# Patient Record
Sex: Female | Born: 1965 | Race: Black or African American | Hispanic: No | Marital: Married | Smoking: Never smoker
Health system: Southern US, Community
[De-identification: ages and names within clinical notes are randomized; demographics above are authoritative.]

## PROBLEM LIST (undated history)

## (undated) HISTORY — PX: WISDOM TOOTH EXTRACTION: SHX21

## (undated) HISTORY — PX: ENDOMETRIAL ABLATION: SHX621

---

## 2002-11-02 ENCOUNTER — Other Ambulatory Visit: Admission: RE | Admit: 2002-11-02 | Discharge: 2002-11-02 | Payer: Self-pay | Admitting: *Deleted

## 2003-07-12 ENCOUNTER — Other Ambulatory Visit: Admission: RE | Admit: 2003-07-12 | Discharge: 2003-07-12 | Payer: Self-pay | Admitting: *Deleted

## 2004-01-31 ENCOUNTER — Other Ambulatory Visit: Admission: RE | Admit: 2004-01-31 | Discharge: 2004-01-31 | Payer: Self-pay | Admitting: *Deleted

## 2004-08-21 ENCOUNTER — Other Ambulatory Visit: Admission: RE | Admit: 2004-08-21 | Discharge: 2004-08-21 | Payer: Self-pay | Admitting: Obstetrics and Gynecology

## 2004-10-20 ENCOUNTER — Other Ambulatory Visit: Admission: RE | Admit: 2004-10-20 | Discharge: 2004-10-20 | Payer: Self-pay | Admitting: Obstetrics and Gynecology

## 2005-06-18 ENCOUNTER — Other Ambulatory Visit: Admission: RE | Admit: 2005-06-18 | Discharge: 2005-06-18 | Payer: Self-pay | Admitting: Obstetrics and Gynecology

## 2005-12-20 ENCOUNTER — Other Ambulatory Visit: Admission: RE | Admit: 2005-12-20 | Discharge: 2005-12-20 | Payer: Self-pay | Admitting: Obstetrics and Gynecology

## 2007-11-17 ENCOUNTER — Encounter: Admission: RE | Admit: 2007-11-17 | Discharge: 2007-11-17 | Payer: Self-pay | Admitting: Obstetrics and Gynecology

## 2008-10-13 ENCOUNTER — Encounter: Admission: RE | Admit: 2008-10-13 | Discharge: 2008-10-13 | Payer: Self-pay | Admitting: Obstetrics and Gynecology

## 2008-10-20 ENCOUNTER — Encounter: Admission: RE | Admit: 2008-10-20 | Discharge: 2008-10-20 | Payer: Self-pay | Admitting: Obstetrics and Gynecology

## 2009-10-13 ENCOUNTER — Encounter: Admission: RE | Admit: 2009-10-13 | Discharge: 2009-10-13 | Payer: Self-pay | Admitting: Obstetrics and Gynecology

## 2010-10-16 ENCOUNTER — Encounter
Admission: RE | Admit: 2010-10-16 | Discharge: 2010-10-16 | Payer: Self-pay | Source: Home / Self Care | Attending: Obstetrics and Gynecology | Admitting: Obstetrics and Gynecology

## 2010-10-24 ENCOUNTER — Encounter
Admission: RE | Admit: 2010-10-24 | Discharge: 2010-10-24 | Payer: Self-pay | Source: Home / Self Care | Attending: Obstetrics and Gynecology | Admitting: Obstetrics and Gynecology

## 2010-10-28 ENCOUNTER — Encounter: Payer: Self-pay | Admitting: Obstetrics and Gynecology

## 2011-09-28 ENCOUNTER — Other Ambulatory Visit: Payer: Self-pay | Admitting: Obstetrics and Gynecology

## 2011-09-28 DIAGNOSIS — N920 Excessive and frequent menstruation with regular cycle: Secondary | ICD-10-CM

## 2011-09-28 DIAGNOSIS — D219 Benign neoplasm of connective and other soft tissue, unspecified: Secondary | ICD-10-CM

## 2011-10-03 ENCOUNTER — Other Ambulatory Visit: Payer: Self-pay | Admitting: Radiology

## 2011-10-03 ENCOUNTER — Other Ambulatory Visit: Payer: Self-pay | Admitting: Emergency Medicine

## 2011-10-03 ENCOUNTER — Ambulatory Visit
Admission: RE | Admit: 2011-10-03 | Discharge: 2011-10-03 | Disposition: A | Discharge status: Home / Self Care | Payer: Managed Care, Other (non HMO) | Source: Ambulatory Visit | Attending: Obstetrics and Gynecology | Admitting: Obstetrics and Gynecology

## 2011-10-03 ENCOUNTER — Other Ambulatory Visit: Payer: Self-pay | Admitting: Obstetrics and Gynecology

## 2011-10-03 DIAGNOSIS — D219 Benign neoplasm of connective and other soft tissue, unspecified: Secondary | ICD-10-CM

## 2011-10-03 DIAGNOSIS — N920 Excessive and frequent menstruation with regular cycle: Secondary | ICD-10-CM

## 2011-10-03 DIAGNOSIS — F40240 Claustrophobia: Secondary | ICD-10-CM

## 2011-10-03 MED ORDER — DIAZEPAM 10 MG PO TABS: 10.0000 mg | ORAL_TABLET | Freq: Once | ORAL | Status: AC

## 2011-10-04 ENCOUNTER — Other Ambulatory Visit: Payer: Self-pay | Admitting: Obstetrics and Gynecology

## 2011-10-04 DIAGNOSIS — Z1231 Encounter for screening mammogram for malignant neoplasm of breast: Secondary | ICD-10-CM

## 2011-10-13 ENCOUNTER — Inpatient Hospital Stay: Admission: RE | Admit: 2011-10-13 | Payer: Self-pay | Source: Ambulatory Visit

## 2011-10-26 ENCOUNTER — Ambulatory Visit: Payer: Managed Care, Other (non HMO)

## 2011-10-27 ENCOUNTER — Inpatient Hospital Stay: Admission: RE | Admit: 2011-10-27 | Payer: Managed Care, Other (non HMO) | Source: Ambulatory Visit

## 2011-10-29 ENCOUNTER — Ambulatory Visit
Admission: RE | Admit: 2011-10-29 | Discharge: 2011-10-29 | Disposition: A | Discharge status: Home / Self Care | Payer: Managed Care, Other (non HMO) | Source: Ambulatory Visit | Attending: Obstetrics and Gynecology | Admitting: Obstetrics and Gynecology

## 2011-10-29 DIAGNOSIS — Z1231 Encounter for screening mammogram for malignant neoplasm of breast: Secondary | ICD-10-CM

## 2011-11-03 ENCOUNTER — Other Ambulatory Visit: Payer: Managed Care, Other (non HMO)

## 2011-11-10 ENCOUNTER — Inpatient Hospital Stay: Admission: RE | Admit: 2011-11-10 | Payer: Managed Care, Other (non HMO) | Source: Ambulatory Visit

## 2011-11-15 ENCOUNTER — Telehealth: Payer: Self-pay | Admitting: Emergency Medicine

## 2011-11-15 NOTE — Telephone Encounter (Signed)
ERROR

## 2011-12-01 ENCOUNTER — Ambulatory Visit
Admission: RE | Admit: 2011-12-01 | Discharge: 2011-12-01 | Disposition: A | Discharge status: Home / Self Care | Payer: Managed Care, Other (non HMO) | Source: Ambulatory Visit | Attending: Obstetrics and Gynecology | Admitting: Obstetrics and Gynecology

## 2011-12-01 DIAGNOSIS — N920 Excessive and frequent menstruation with regular cycle: Secondary | ICD-10-CM

## 2011-12-01 DIAGNOSIS — D219 Benign neoplasm of connective and other soft tissue, unspecified: Secondary | ICD-10-CM

## 2011-12-01 MED ORDER — GADOBENATE DIMEGLUMINE 529 MG/ML IV SOLN
15.0000 mL | Freq: Once | INTRAVENOUS | Status: AC | PRN
  Administered 2011-12-01: 15 mL via INTRAVENOUS

## 2012-01-09 ENCOUNTER — Telehealth: Payer: Self-pay | Admitting: Emergency Medicine

## 2012-01-09 NOTE — Telephone Encounter (Signed)
LM TO SEE IF PT IS INTERESTED IN UFE AND IF SHE HAS HAD THE EBX PERFORMED.   ebx on 01-22-12

## 2012-01-29 ENCOUNTER — Other Ambulatory Visit (HOSPITAL_COMMUNITY): Payer: Self-pay | Admitting: Interventional Radiology

## 2012-01-29 DIAGNOSIS — D219 Benign neoplasm of connective and other soft tissue, unspecified: Secondary | ICD-10-CM

## 2012-02-10 ENCOUNTER — Other Ambulatory Visit: Payer: Self-pay | Admitting: Radiology

## 2012-02-11 ENCOUNTER — Other Ambulatory Visit: Payer: Self-pay | Admitting: Physician Assistant

## 2012-02-11 ENCOUNTER — Telehealth (HOSPITAL_COMMUNITY): Payer: Self-pay | Admitting: Interventional Radiology

## 2012-02-12 ENCOUNTER — Ambulatory Visit (HOSPITAL_COMMUNITY)
Admission: RE | Admit: 2012-02-12 | Discharge: 2012-02-13 | Disposition: A | Discharge status: Home / Self Care | Payer: Managed Care, Other (non HMO) | Source: Ambulatory Visit | Attending: Interventional Radiology | Admitting: Interventional Radiology

## 2012-02-12 ENCOUNTER — Other Ambulatory Visit: Payer: Self-pay | Admitting: Radiology

## 2012-02-12 ENCOUNTER — Encounter (HOSPITAL_COMMUNITY): Payer: Self-pay

## 2012-02-12 ENCOUNTER — Other Ambulatory Visit (HOSPITAL_COMMUNITY): Payer: Managed Care, Other (non HMO)

## 2012-02-12 VITALS — BP 117/71 | HR 63 | Temp 98.0°F | Resp 16 | Ht 71.0 in | Wt 162.0 lb

## 2012-02-12 DIAGNOSIS — D219 Benign neoplasm of connective and other soft tissue, unspecified: Secondary | ICD-10-CM

## 2012-02-12 DIAGNOSIS — D259 Leiomyoma of uterus, unspecified: Principal | ICD-10-CM | POA: Insufficient documentation

## 2012-02-12 DIAGNOSIS — N920 Excessive and frequent menstruation with regular cycle: Secondary | ICD-10-CM | POA: Diagnosis present

## 2012-02-12 DIAGNOSIS — R110 Nausea: Secondary | ICD-10-CM | POA: Insufficient documentation

## 2012-02-12 LAB — PROTIME-INR
INR: 0.92 (ref 0.00–1.49)
Prothrombin Time: 12.6 seconds (ref 11.6–15.2)

## 2012-02-12 LAB — CBC
HCT: 37.3 % (ref 36.0–46.0)
Hemoglobin: 12.5 g/dL (ref 12.0–15.0)
MCH: 28.5 pg (ref 26.0–34.0)
MCHC: 33.5 g/dL (ref 30.0–36.0)
MCV: 85.2 fL (ref 78.0–100.0)
Platelets: 368 10*3/uL (ref 150–400)
RBC: 4.38 MIL/uL (ref 3.87–5.11)
RDW: 13.9 % (ref 11.5–15.5)
WBC: 7.6 10*3/uL (ref 4.0–10.5)

## 2012-02-12 LAB — BASIC METABOLIC PANEL
BUN: 12 mg/dL (ref 6–23)
CO2: 20 mEq/L (ref 19–32)
Calcium: 8.8 mg/dL (ref 8.4–10.5)
Chloride: 103 mEq/L (ref 96–112)
Creatinine, Ser: 0.72 mg/dL (ref 0.50–1.10)
GFR calc Af Amer: >90 mL/min (ref >90)
GFR calc non Af Amer: >90 mL/min (ref >90)
Glucose, Bld: 81 mg/dL (ref 70–99)
Potassium: 4.3 mEq/L (ref 3.5–5.1)
Sodium: 135 mEq/L (ref 135–145)

## 2012-02-12 LAB — APTT: aPTT: 32 seconds (ref 24–37)

## 2012-02-12 LAB — HCG, SERUM, QUALITATIVE: Preg, Serum: NEGATIVE

## 2012-02-12 MED ORDER — HYDROMORPHONE 0.3 MG/ML IV SOLN
INTRAVENOUS | Status: DC
  Administered 2012-02-12: 5.39 mg via INTRAVENOUS
  Administered 2012-02-13: 0.6 mg via INTRAVENOUS
  Administered 2012-02-13: 01:00:00 via INTRAVENOUS
  Administered 2012-02-13: 1.99 mg via INTRAVENOUS
  Filled 2012-02-12: qty 25

## 2012-02-12 MED ORDER — DIPHENHYDRAMINE HCL 50 MG/ML IJ SOLN: 12.5000 mg | Freq: Four times a day (QID) | INTRAMUSCULAR | Status: DC | PRN

## 2012-02-12 MED ORDER — CEFAZOLIN SODIUM 1-5 GM-% IV SOLN
1.0000 g | INTRAVENOUS | Status: AC
  Administered 2012-02-12: 1 g via INTRAVENOUS

## 2012-02-12 MED ORDER — DOCUSATE SODIUM 100 MG PO CAPS
100.0000 mg | ORAL_CAPSULE | Freq: Two times a day (BID) | ORAL | Status: DC
  Administered 2012-02-12 (×2): 100 mg via ORAL
  Filled 2012-02-12 (×4): qty 1

## 2012-02-12 MED ORDER — HYDROMORPHONE 0.3 MG/ML IV SOLN: INTRAVENOUS | Status: DC

## 2012-02-12 MED ORDER — ONDANSETRON HCL 4 MG/2ML IJ SOLN: 4.0000 mg | Freq: Four times a day (QID) | INTRAMUSCULAR | Status: DC | PRN

## 2012-02-12 MED ORDER — DIPHENHYDRAMINE HCL 12.5 MG/5ML PO ELIX: 12.5000 mg | ORAL_SOLUTION | Freq: Four times a day (QID) | ORAL | Status: DC | PRN

## 2012-02-12 MED ORDER — HYDROMORPHONE 0.3 MG/ML IV SOLN
INTRAVENOUS | Status: DC
  Administered 2012-02-12: 11:00:00 via INTRAVENOUS

## 2012-02-12 MED ORDER — KETOROLAC TROMETHAMINE 30 MG/ML IJ SOLN
INTRAMUSCULAR | Status: AC
  Filled 2012-02-12: qty 1

## 2012-02-12 MED ORDER — SODIUM CHLORIDE 0.9 % IV SOLN: 250.0000 mL | INTRAVENOUS | Status: DC | PRN

## 2012-02-12 MED ORDER — FENTANYL CITRATE 0.05 MG/ML IJ SOLN
INTRAMUSCULAR | Status: AC
  Filled 2012-02-12: qty 8

## 2012-02-12 MED ORDER — IBUPROFEN 800 MG PO TABS
800.0000 mg | ORAL_TABLET | Freq: Four times a day (QID) | ORAL | Status: DC
Start: 2012-02-12 — End: 2012-02-13
  Administered 2012-02-12 – 2012-02-13 (×4): 800 mg via ORAL
  Filled 2012-02-12 (×7): qty 1

## 2012-02-12 MED ORDER — MIDAZOLAM HCL 2 MG/2ML IJ SOLN
INTRAMUSCULAR | Status: AC
  Filled 2012-02-12: qty 6

## 2012-02-12 MED ORDER — KETOROLAC TROMETHAMINE 30 MG/ML IJ SOLN
30.0000 mg | INTRAMUSCULAR | Status: AC
  Administered 2012-02-12: 30 mg via INTRAVENOUS

## 2012-02-12 MED ORDER — ONDANSETRON HCL 4 MG/2ML IJ SOLN
4.0000 mg | Freq: Four times a day (QID) | INTRAMUSCULAR | Status: DC | PRN
  Administered 2012-02-12 – 2012-02-13 (×2): 4 mg via INTRAVENOUS
  Filled 2012-02-12 (×2): qty 2

## 2012-02-12 MED ORDER — MIDAZOLAM HCL 2 MG/2ML IJ SOLN
INTRAMUSCULAR | Status: AC
  Filled 2012-02-12: qty 2

## 2012-02-12 MED ORDER — NALOXONE HCL 0.4 MG/ML IJ SOLN: 0.4000 mg | INTRAMUSCULAR | Status: DC | PRN

## 2012-02-12 MED ORDER — FENTANYL CITRATE 0.05 MG/ML IJ SOLN
INTRAMUSCULAR | Status: AC | PRN
  Administered 2012-02-12 (×2): 100 ug via INTRAVENOUS

## 2012-02-12 MED ORDER — SODIUM CHLORIDE 0.9 % IV SOLN: INTRAVENOUS | Status: DC

## 2012-02-12 MED ORDER — MIDAZOLAM HCL 5 MG/5ML IJ SOLN
INTRAMUSCULAR | Status: AC | PRN
  Administered 2012-02-12 (×3): 2 mg via INTRAVENOUS

## 2012-02-12 MED ORDER — PROMETHAZINE HCL 25 MG RE SUPP: 25.0000 mg | Freq: Three times a day (TID) | RECTAL | Status: DC | PRN

## 2012-02-12 MED ORDER — SODIUM CHLORIDE 0.9 % IJ SOLN
3.0000 mL | Freq: Two times a day (BID) | INTRAMUSCULAR | Status: DC
  Administered 2012-02-12: 3 mL via INTRAVENOUS

## 2012-02-12 MED ORDER — SODIUM CHLORIDE 0.9 % IJ SOLN: 3.0000 mL | INTRAMUSCULAR | Status: DC | PRN

## 2012-02-12 MED ORDER — DIPHENHYDRAMINE HCL 12.5 MG/5ML PO ELIX
12.5000 mg | ORAL_SOLUTION | Freq: Four times a day (QID) | ORAL | Status: DC | PRN
  Filled 2012-02-12: qty 5

## 2012-02-12 MED ORDER — SODIUM CHLORIDE 0.9 % IJ SOLN: 9.0000 mL | INTRAMUSCULAR | Status: DC | PRN

## 2012-02-12 MED ORDER — CEFAZOLIN SODIUM 1-5 GM-% IV SOLN
INTRAVENOUS | Status: AC
  Filled 2012-02-12: qty 50

## 2012-02-12 MED ORDER — PROMETHAZINE HCL 25 MG PO TABS: 25.0000 mg | ORAL_TABLET | Freq: Three times a day (TID) | ORAL | Status: DC | PRN

## 2012-02-12 NOTE — H&P (Signed)
Robin Wheeler is an 46 y.o. female.   Chief Complaint: symptomatic uterine fibroids HPI: Patient with history of menorrhagia, bloating secondary to uterine fibroids presents today for elective bilateral uterine artery embolization.  PMH: fibroids PSH: none FH:  Positive for asthma,DM, HTN, heart disease                                                                              Social History: married, 1 daughter, denies alcohol, tobacco; Jehovah's witness Allergies:  Allergies  Allergen Reactions  . Vicodin (Hydrocodone-Acetaminophen) Nausea And Vomiting    Current outpatient prescriptions:cholecalciferol (VITAMIN D) 1000 UNITS tablet, Take 1,000 Units by mouth daily., Disp: , Rfl: ;  omega-3 acid ethyl esters (LOVAZA) 1 G capsule, Take 2 g by mouth daily., Disp: , Rfl: ;  vitamin C (ASCORBIC ACID) 500 MG tablet, Take 500 mg by mouth daily., Disp: , Rfl:  Current facility-administered medications:0.9 %  sodium chloride infusion, , Intravenous, Continuous, D Kevin Americus Scheurich, PA;  ceFAZolin (ANCEF) 1-5 GM-% IVPB, , , , ;  ceFAZolin (ANCEF) IVPB 1 g/50 mL premix, 1 g, Intravenous, On Call, D Kevin Tron Flythe, PA;  ketorolac (TORADOL) 30 MG/ML injection 30 mg, 30 mg, Intravenous, On Call, D Jeananne Rama, PA   Results for orders placed during the hospital encounter of 02/12/12 (from the past 48 hour(s))  APTT     Status: Normal   Collection Time   02/12/12  8:10 AM      Component Value Range Comment   aPTT 32  24 - 37 (seconds)   BASIC METABOLIC PANEL     Status: Normal   Collection Time   02/12/12  8:10 AM      Component Value Range Comment   Sodium 135  135 - 145 (mEq/L)    Potassium 4.3  3.5 - 5.1 (mEq/L)    Chloride 103  96 - 112 (mEq/L)    CO2 20  19 - 32 (mEq/L)    Glucose, Bld 81  70 - 99 (mg/dL)    BUN 12  6 - 23 (mg/dL)    Creatinine, Ser 1.61  0.50 - 1.10 (mg/dL)    Calcium 8.8  8.4 - 10.5 (mg/dL)    GFR calc non Af Amer >90  >90 (mL/min)    GFR calc Af Amer >90  >90 (mL/min)   CBC      Status: Normal   Collection Time   02/12/12  8:10 AM      Component Value Range Comment   WBC 7.6  4.0 - 10.5 (K/uL)    RBC 4.38  3.87 - 5.11 (MIL/uL)    Hemoglobin 12.5  12.0 - 15.0 (g/dL)    HCT 09.6  04.5 - 40.9 (%)    MCV 85.2  78.0 - 100.0 (fL)    MCH 28.5  26.0 - 34.0 (pg)    MCHC 33.5  30.0 - 36.0 (g/dL)    RDW 81.1  91.4 - 78.2 (%)    Platelets 368  150 - 400 (K/uL)   PROTIME-INR     Status: Normal   Collection Time   02/12/12  8:10 AM      Component Value Range Comment   Prothrombin  Time 12.6  11.6 - 15.2 (seconds)    INR 0.92  0.00 - 1.49     No results found.  Review of Systems  Constitutional: Negative for fever and chills.  Respiratory: Negative for cough and shortness of breath.   Cardiovascular: Negative for chest pain.  Gastrointestinal: Positive for abdominal pain. Negative for nausea and vomiting.       Intermittent pelvic pain  Genitourinary:       Menorrhagia   Musculoskeletal: Negative for back pain.  Neurological: Positive for headaches.    Blood pressure 129/89, pulse 73, temperature 98 F (36.7 C), temperature source Oral, resp. rate 21, last menstrual period 01/26/2012, SpO2 100.00%. Physical Exam  Constitutional: She is oriented to person, place, and time. She appears well-developed and well-nourished.  Cardiovascular: Normal rate and regular rhythm.   Respiratory: Effort normal and breath sounds normal.  GI: Soft. Bowel sounds are normal.       Mildly tender mid pelvic region  Musculoskeletal: Normal range of motion. She exhibits no edema.  Neurological: She is alert and oriented to person, place, and time.     Assessment/Plan Patient with symptomatic uterine fibroids; plan is for bilateral uterine artery embolization followed by overnight observation for pain control/hemodynamic monitoring. Details/risks of procedure d/w pt/family with their understanding and consent.  Ranveer Wahlstrom,D [facility] 02/12/2012, 9:07 AM

## 2012-02-12 NOTE — Procedures (Signed)
Post bilateral UFE.  No immediate complications.  Keep right leg straight for 4 hrs.

## 2012-02-12 NOTE — Progress Notes (Signed)
Subjective: Pt drowsy but arousable; had some N/V earlier, currently ok; mild cramping  Objective: Vital signs in last 24 hours: Temp:  [97.3 F (36.3 C)-98.1 F (36.7 C)] 97.3 F (36.3 C) (05/21 1514) Pulse Rate:  [61-81] 81  (05/21 1514) Resp:  [6-21] 13  (05/21 1514) BP: (110-150)/(69-134) 116/77 mmHg (05/21 1514) SpO2:  [90 %-100 %] 96 % (05/21 1514) Last BM Date: 02/11/12  Intake/Output from previous day:   Intake/Output this shift:    Pt drowsy; chest- CTA bilat; heart-RRR, abd.- soft, +BS, mildly tender pelvic region; rt groin soft, NT, no hematoma; rt foot warm, distal pulses 1+; yellow urine in foley  Lab Results:   [facility] 02/12/12 0810  WBC 7.6  HGB 12.5  HCT 37.3  PLT 368   BMET  Basename 02/12/12 0810  NA 135  K 4.3  CL 103  CO2 20  GLUCOSE 81  BUN 12  CREATININE 0.72  CALCIUM 8.8   PT/INR  Basename 02/12/12 0810  LABPROT 12.6  INR 0.92   ABG No results found for this basename: PHART:2,PCO2:2,PO2:2,HCO3:2 in the last 72 hours  Studies/Results: Ir Uterine Fibroid Embolization Mod Sed  02/12/2012  *RADIOLOGY REPORT*  Indication: Symptomatic uterine fibroids  1.  UTERINE FIBROID EMBOLIZATION 2.  ULTRASOUND GUIDANCE FOR ARTERIAL ACCESS  Comparison:  Pelvic MRI - 12/01/2011  Medications:  Fentanyl 200 mcg IV; Versed 6 mg IV; Toradol 30 mg IV; Ancef 1 grams IV; administered within one hour of the procedure  Sedation time: 105 minutes  Contrast volume: 100 mL Omnipaque-300  Fluoroscopy time:  20.8 minutes  Complications: None immediate  PROCEDURE / FINDINGS:  Informed consent was obtained from the patient following explanation of the procedure, risks, benefits and alternatives. The patient understands, agrees and consents for the procedure. All questions were addressed.  A time out was performed prior to the initiation of the procedure.  Maximal barrier sterile technique utilized including caps, mask, sterile gowns, sterile gloves, large sterile drape,  hand hygiene, and Betadine prep.  The right femoral head was marked fluoroscopically.  Under sterile conditions and local anesthesia, the right common femoral artery access was performed with a micropuncture needle.  Under direct ultrasound guidance, the right common femoral was accessed with a micropuncture kit.  An ultrasound image was saved for documentation purposes. This allowed for placement of a 5-French vascular sheath. A limited arteriogram was performed through the side arm of the sheath confirming appropriate access within the right common femoral artery.  Utilizing an Omniflush catheter, a stiff glide wire was advanced into the contralateral left common femoral artery.  Under intermittent fluoroscopic guidance, the Omniflush catheter was exchanged for a Robert's uterine catheter.  The 5-French Robert's uterine catheter was utilized to select the contralateral left internal iliac artery.  Selective left internal iliac angiogram was performed.  The origin of the tortuous left uterine artery was identified.  Selective catheterization was performed of the left uterine artery with a microcatheter and micro guide wire.  A selective left uterine angiogram was performed. This demonstrated patency of the left uterine artery.  Mild diffuse hypervascularity of the enlarged fibroid uterus. The micro catheter was advanced beyond the takeoff of the left-sided cervical vaginal branch.  Limited subselective injection of the left uterine artery was performed and deemed safe for embolization. For embolization, 1 vial of 500 - 700 micron Embospheres were injected into the left uterine artery.  Post embolization angiogram confirms complete stasis of the left uterine vascular territory.  Microcatheter was removed.  The Robert's uterine catheter was retracted and utilized to select the right internal iliac artery.  Selective right internal iliac angiogram was performed.  The patent right uterine artery was identified.  For  selective catheterization, the microcatheter and guidewire were utilized to select the right uterine artery. Selective right uterine angiogram was performed.  Catheter position was safe for embolization.  Embolization was performed to complete stasis with injection of 1 vial of 500-700 micron Embospheres. Post embolization angiogram confirms complete stasis of the right uterine vascular territory.  At this point, all wires, catheters and sheaths were removed from the patient.  Hemostasis was achieved at the right groin access site with manual compression.  The patient tolerated the procedure well without immediate post procedural complication.  IMPRESSION:  Successful bilateral uterine artery embolization (U F E).  Original Report Authenticated By: Waynard Reeds, M.D.    Anti-infectives: Anti-infectives     Start     Dose/Rate Route Frequency Ordered Stop   02/12/12 0815   ceFAZolin (ANCEF) IVPB 1 g/50 mL premix        1 g 100 mL/hr over 30 Minutes Intravenous On call 02/12/12 0812 02/12/12 1040   02/12/12 0807   ceFAZolin (ANCEF) 1-5 GM-% IVPB     Comments: DUININCK, STACEY: cabinet override         02/12/12 0807 02/12/12 2014          Assessment/Plan: s/p bilateral uterine artery embolization secondary to symptomatic fibroids; d/c foley; advance diet as tolerated; IV hydration; dilaudid PCA/antiemetics prn; for overnight obs   LOS: 0 days    Dimitrius Steedman,D [facility] 02/12/2012

## 2012-02-13 MED ORDER — DSS 100 MG PO CAPS: 100.0000 mg | ORAL_CAPSULE | Freq: Two times a day (BID) | ORAL | Status: AC

## 2012-02-13 MED ORDER — HYDROMORPHONE HCL PF 1 MG/ML IJ SOLN: 0.5000 mg | INTRAMUSCULAR | Status: DC | PRN

## 2012-02-13 MED ORDER — IBUPROFEN 600 MG PO TABS: 600.0000 mg | ORAL_TABLET | Freq: Three times a day (TID) | ORAL | Status: AC

## 2012-02-13 MED ORDER — SODIUM CHLORIDE 0.9 % IV SOLN: INTRAVENOUS | Status: DC

## 2012-02-13 NOTE — Progress Notes (Signed)
Subjective: Pt has been nauseated all night and early am. Has now tolerated small amount of apple, but nothing more. Pain control ok. Not using PCA. Voiding on own ok. Has only been OOB to bathroom.  Objective: Physical Exam: BP 121/76  Pulse 80  Temp(Src) 98.2 F (36.8 C) (Oral)  Resp 16  Ht 5\' 11"  (1.803 m)  Wt 162 lb (73.483 kg)  BMI 22.59 kg/m2  SpO2 98%  LMP 01/26/2012 Lungs: CTA without w/r/r Heart: Regular Abdomen: soft, ND, NT Ext: (R)groin site clean, no hematoma, pedal pulses intact    Labs: CBC  Basename 02/12/12 0810  WBC 7.6  HGB 12.5  HCT 37.3  PLT 368   BMET  Basename 02/12/12 0810  NA 135  K 4.3  CL 103  CO2 20  GLUCOSE 81  BUN 12  CREATININE 0.72  CALCIUM 8.8   LFT No results found for this basename: PROT,ALBUMIN,AST,ALT,ALKPHOS,BILITOT,BILIDIR,IBILI,LIPASE in the last 72 hours PT/INR  Basename 02/12/12 0810  LABPROT 12.6  INR 0.92     Studies/Results: Ir Uterine Fibroid Embolization Mod Sed  02/12/2012  *RADIOLOGY REPORT*  Indication: Symptomatic uterine fibroids  1.  UTERINE FIBROID EMBOLIZATION 2.  ULTRASOUND GUIDANCE FOR ARTERIAL ACCESS  Comparison:  Pelvic MRI - 12/01/2011  Medications:  Fentanyl 200 mcg IV; Versed 6 mg IV; Toradol 30 mg IV; Ancef 1 grams IV; administered within one hour of the procedure  Sedation time: 105 minutes  Contrast volume: 100 mL Omnipaque-300  Fluoroscopy time:  20.8 minutes  Complications: None immediate  PROCEDURE / FINDINGS:  Informed consent was obtained from the patient following explanation of the procedure, risks, benefits and alternatives. The patient understands, agrees and consents for the procedure. All questions were addressed.  A time out was performed prior to the initiation of the procedure.  Maximal barrier sterile technique utilized including caps, mask, sterile gowns, sterile gloves, large sterile drape, hand hygiene, and Betadine prep.  The right femoral head was marked fluoroscopically.   Under sterile conditions and local anesthesia, the right common femoral artery access was performed with a micropuncture needle.  Under direct ultrasound guidance, the right common femoral was accessed with a micropuncture kit.  An ultrasound image was saved for documentation purposes. This allowed for placement of a 5-French vascular sheath. A limited arteriogram was performed through the side arm of the sheath confirming appropriate access within the right common femoral artery.  Utilizing an Omniflush catheter, a stiff glide wire was advanced into the contralateral left common femoral artery.  Under intermittent fluoroscopic guidance, the Omniflush catheter was exchanged for a Robert's uterine catheter.  The 5-French Robert's uterine catheter was utilized to select the contralateral left internal iliac artery.  Selective left internal iliac angiogram was performed.  The origin of the tortuous left uterine artery was identified.  Selective catheterization was performed of the left uterine artery with a microcatheter and micro guide wire.  A selective left uterine angiogram was performed. This demonstrated patency of the left uterine artery.  Mild diffuse hypervascularity of the enlarged fibroid uterus. The micro catheter was advanced beyond the takeoff of the left-sided cervical vaginal branch.  Limited subselective injection of the left uterine artery was performed and deemed safe for embolization. For embolization, 1 vial of 500 - 700 micron Embospheres were injected into the left uterine artery.  Post embolization angiogram confirms complete stasis of the left uterine vascular territory.  Microcatheter was removed.  The Robert's uterine catheter was retracted and utilized to select the right internal  iliac artery.  Selective right internal iliac angiogram was performed.  The patent right uterine artery was identified.  For selective catheterization, the microcatheter and guidewire were utilized to select the  right uterine artery. Selective right uterine angiogram was performed.  Catheter position was safe for embolization.  Embolization was performed to complete stasis with injection of 1 vial of 500-700 micron Embospheres. Post embolization angiogram confirms complete stasis of the right uterine vascular territory.  At this point, all wires, catheters and sheaths were removed from the patient.  Hemostasis was achieved at the right groin access site with manual compression.  The patient tolerated the procedure well without immediate post procedural complication.  IMPRESSION:  Successful bilateral uterine artery embolization (U F E).  Original Report Authenticated By: Waynard Reeds, M.D.    Assessment/Plan: S/p (B)UAE for fibroids. POst-op nausea-hopefully improving Increase OOB, DC PCA Hopefully dc later this afternoon.    LOS: 1 day    Brayton El PA-C 02/13/2012 10:42 AM  Pt

## 2012-02-13 NOTE — Progress Notes (Signed)
Pt. Ambulating in the hallway with husband, tolerated well. Eating and drinking without any problems. Discharge instructions explained to pt,.pt denies having any questions. Prescriptions given to pt. D/C'd to mother

## 2012-02-13 NOTE — Progress Notes (Signed)
PCA Dilaudid D/C'd as ordered. Pt informed of PRN Dialudid that's been ordered

## 2012-02-14 ENCOUNTER — Other Ambulatory Visit: Payer: Self-pay | Admitting: Interventional Radiology

## 2012-02-14 DIAGNOSIS — N920 Excessive and frequent menstruation with regular cycle: Secondary | ICD-10-CM | POA: Diagnosis present

## 2012-02-14 DIAGNOSIS — D219 Benign neoplasm of connective and other soft tissue, unspecified: Secondary | ICD-10-CM | POA: Diagnosis present

## 2012-02-14 NOTE — Discharge Summary (Signed)
Physician Discharge Summary  Patient ID: Robin Wheeler MRN: [id number] DOB/AGE: Aug 23, 1966 45 y.o.  Admit date: 02/12/2012 Discharge date: 02/13/2012  Admitting Physician: Dr. Katherina Right  Admission Diagnoses: Principal Problem:  *Fibroids Active Problems:  Menorrhagia  Discharge Diagnoses:  Principal Problem:  *Fibroids Active Problems:  Menorrhagia    Procedures: Bilateral Uterine artery embolization 02/12/12  Discharged Condition: good  Hospital Course: HPI: Patient with history of menorrhagia, bloating secondary to uterine fibroids presented for elective bilateral uterine artery embolization. After seeing Dr. Grace Isaac in the office, she was scheduled for procedure. Pt brought to IR suite at [facility] and underwent successful (B)UAE. She tolerated the procedure well and was admitted to the floor in stable condition. She had some significant nausea that evening and into the morning of POD#1. However, after her PCA Dilaudid was discontinues, she felt much better. She was moving her bowels and voiding well on her own. She tolerated some regular diet and was up ambulating. She was then determined to be stable for discharge. She had no other complications or events during her stay.   Consults: None   Discharge Exam: Blood pressure 117/71, pulse 63, temperature 98 F (36.7 C), temperature source Oral, resp. rate 16, height 5\' 11"  (1.803 m), weight 162 lb (73.483 kg), last menstrual period 01/26/2012, SpO2 98.00%. Lungs: CTA without w/r/r Heart: Regular Abdomen: soft, NT, ND Ext: (R)groin site clean, no hematoma or tenderness. Feet warm with normal cap refill   Disposition: 01-Home or Self Care  Discharge Orders    Future Appointments: Provider: Department: Dept Phone: Center:   03/04/2012 7:30 AM [facility] [phone] [facility]     Future Orders Please Complete By Expires   Diet general      Increase activity slowly      May shower / Bathe       Driving Restrictions      Comments:   No driving for 3-4 days.   Lifting restrictions      Comments:   No heavy lifting greater than 10lbs for 1 week.   Remove dressing in 24 hours      Call MD for:  temperature >100.4      Call MD for:  persistant nausea and vomiting      Call MD for:  severe uncontrolled pain      Call MD for:  redness, tenderness, or signs of infection (pain, swelling, redness, odor or green/yellow discharge around incision site)        Medication List  As of 02/14/2012  4:06 PM   TAKE these medications         cholecalciferol 1000 UNITS tablet   Commonly known as: VITAMIN D   Take 1,000 Units by mouth daily.      DSS 100 MG Caps   Take 100 mg by mouth 2 (two) times daily.      ibuprofen 600 MG tablet   Commonly known as: ADVIL,MOTRIN   Take 1 tablet (600 mg total) by mouth 3 (three) times daily.      omega-3 acid ethyl esters 1 G capsule   Commonly known as: LOVAZA   Take 2 g by mouth daily.      vitamin C 500 MG tablet   Commonly known as: ASCORBIC ACID   Take 500 mg by mouth daily.           Follow-up Information    Follow up with Simonne Come, MD in 4 weeks. (Office will call with  date and time.)    Contact information:   [facility]  [phone]          Signed: Brayton El PA-C 02/14/2012, 4:06 PM

## 2012-03-04 ENCOUNTER — Ambulatory Visit
Admission: RE | Admit: 2012-03-04 | Discharge: 2012-03-04 | Disposition: A | Discharge status: Home / Self Care | Payer: Managed Care, Other (non HMO) | Source: Ambulatory Visit | Attending: Interventional Radiology | Admitting: Interventional Radiology

## 2012-03-04 DIAGNOSIS — D219 Benign neoplasm of connective and other soft tissue, unspecified: Secondary | ICD-10-CM

## 2012-05-29 ENCOUNTER — Telehealth: Payer: Self-pay | Admitting: Radiology

## 2012-05-29 NOTE — Telephone Encounter (Signed)
3 mo f/u [place] call to check patient status.

## 2012-08-13 ENCOUNTER — Other Ambulatory Visit: Payer: Self-pay | Admitting: Interventional Radiology

## 2012-08-13 DIAGNOSIS — D219 Benign neoplasm of connective and other soft tissue, unspecified: Secondary | ICD-10-CM

## 2012-10-14 ENCOUNTER — Other Ambulatory Visit: Payer: Self-pay | Admitting: Obstetrics and Gynecology

## 2012-10-14 DIAGNOSIS — Z1231 Encounter for screening mammogram for malignant neoplasm of breast: Secondary | ICD-10-CM

## 2012-10-23 ENCOUNTER — Encounter: Payer: Self-pay | Admitting: Radiology

## 2012-11-10 ENCOUNTER — Ambulatory Visit
Admission: RE | Admit: 2012-11-10 | Discharge: 2012-11-10 | Disposition: A | Discharge status: Home / Self Care | Payer: Managed Care, Other (non HMO) | Source: Ambulatory Visit | Attending: Obstetrics and Gynecology | Admitting: Obstetrics and Gynecology

## 2012-11-10 DIAGNOSIS — Z1231 Encounter for screening mammogram for malignant neoplasm of breast: Secondary | ICD-10-CM

## 2013-03-13 NOTE — Telephone Encounter (Signed)
e

## 2013-11-23 ENCOUNTER — Other Ambulatory Visit: Payer: Self-pay

## 2013-11-23 DIAGNOSIS — Z1231 Encounter for screening mammogram for malignant neoplasm of breast: Secondary | ICD-10-CM

## 2013-12-09 ENCOUNTER — Ambulatory Visit
Admission: RE | Admit: 2013-12-09 | Discharge: 2013-12-09 | Disposition: A | Discharge status: Home / Self Care | Payer: Managed Care, Other (non HMO) | Source: Ambulatory Visit

## 2013-12-09 DIAGNOSIS — Z1231 Encounter for screening mammogram for malignant neoplasm of breast: Secondary | ICD-10-CM

## 2014-10-26 ENCOUNTER — Other Ambulatory Visit: Payer: Self-pay

## 2014-10-26 DIAGNOSIS — Z1231 Encounter for screening mammogram for malignant neoplasm of breast: Secondary | ICD-10-CM

## 2014-12-13 ENCOUNTER — Ambulatory Visit
Admission: RE | Admit: 2014-12-13 | Discharge: 2014-12-13 | Disposition: A | Discharge status: Home / Self Care | Payer: Managed Care, Other (non HMO) | Source: Ambulatory Visit

## 2014-12-13 DIAGNOSIS — Z1231 Encounter for screening mammogram for malignant neoplasm of breast: Secondary | ICD-10-CM

## 2015-03-29 ENCOUNTER — Other Ambulatory Visit: Payer: Self-pay | Admitting: Obstetrics and Gynecology

## 2015-03-29 DIAGNOSIS — N6320 Unspecified lump in the left breast, unspecified quadrant: Secondary | ICD-10-CM

## 2015-04-01 ENCOUNTER — Ambulatory Visit
Admission: RE | Admit: 2015-04-01 | Discharge: 2015-04-01 | Disposition: A | Discharge status: Home / Self Care | Payer: 59 | Source: Ambulatory Visit | Attending: Obstetrics and Gynecology | Admitting: Obstetrics and Gynecology

## 2015-04-01 ENCOUNTER — Other Ambulatory Visit: Payer: Managed Care, Other (non HMO)

## 2015-04-01 DIAGNOSIS — N6320 Unspecified lump in the left breast, unspecified quadrant: Secondary | ICD-10-CM

## 2016-06-22 ENCOUNTER — Other Ambulatory Visit: Payer: Self-pay | Admitting: Obstetrics and Gynecology

## 2016-06-22 DIAGNOSIS — Z1231 Encounter for screening mammogram for malignant neoplasm of breast: Secondary | ICD-10-CM

## 2016-07-23 ENCOUNTER — Ambulatory Visit
Admission: RE | Admit: 2016-07-23 | Discharge: 2016-07-23 | Disposition: A | Discharge status: Home / Self Care | Payer: 59 | Source: Ambulatory Visit | Attending: Obstetrics and Gynecology | Admitting: Obstetrics and Gynecology

## 2016-07-23 DIAGNOSIS — Z1231 Encounter for screening mammogram for malignant neoplasm of breast: Secondary | ICD-10-CM

## 2017-06-21 ENCOUNTER — Other Ambulatory Visit: Payer: Self-pay | Admitting: Obstetrics and Gynecology

## 2017-06-21 DIAGNOSIS — Z1231 Encounter for screening mammogram for malignant neoplasm of breast: Secondary | ICD-10-CM

## 2017-08-12 ENCOUNTER — Ambulatory Visit: Payer: 59

## 2017-08-12 ENCOUNTER — Ambulatory Visit
Admission: RE | Admit: 2017-08-12 | Discharge: 2017-08-12 | Disposition: A | Discharge status: Home / Self Care | Payer: Managed Care, Other (non HMO) | Source: Ambulatory Visit | Attending: Obstetrics and Gynecology | Admitting: Obstetrics and Gynecology

## 2017-08-12 DIAGNOSIS — Z1231 Encounter for screening mammogram for malignant neoplasm of breast: Secondary | ICD-10-CM

## 2017-09-24 HISTORY — PX: BREAST BIOPSY: SHX20

## 2018-02-19 ENCOUNTER — Other Ambulatory Visit: Payer: Self-pay | Admitting: Obstetrics and Gynecology

## 2018-02-19 DIAGNOSIS — Z803 Family history of malignant neoplasm of breast: Secondary | ICD-10-CM

## 2018-04-19 ENCOUNTER — Ambulatory Visit
Admission: RE | Admit: 2018-04-19 | Discharge: 2018-04-19 | Disposition: A | Discharge status: Home / Self Care | Payer: Managed Care, Other (non HMO) | Source: Ambulatory Visit | Attending: Obstetrics and Gynecology | Admitting: Obstetrics and Gynecology

## 2018-04-19 DIAGNOSIS — Z803 Family history of malignant neoplasm of breast: Secondary | ICD-10-CM

## 2018-04-19 MED ORDER — GADOBENATE DIMEGLUMINE 529 MG/ML IV SOLN
15.0000 mL | Freq: Once | INTRAVENOUS | Status: AC | PRN
  Administered 2018-04-19: 15 mL via INTRAVENOUS

## 2018-04-22 ENCOUNTER — Other Ambulatory Visit: Payer: Self-pay | Admitting: Obstetrics and Gynecology

## 2018-04-22 DIAGNOSIS — N63 Unspecified lump in unspecified breast: Secondary | ICD-10-CM

## 2018-04-23 ENCOUNTER — Other Ambulatory Visit: Payer: Managed Care, Other (non HMO)

## 2018-04-24 ENCOUNTER — Ambulatory Visit
Admission: RE | Admit: 2018-04-24 | Discharge: 2018-04-24 | Disposition: A | Discharge status: Home / Self Care | Payer: Managed Care, Other (non HMO) | Source: Ambulatory Visit | Attending: Obstetrics and Gynecology | Admitting: Obstetrics and Gynecology

## 2018-04-24 ENCOUNTER — Other Ambulatory Visit (HOSPITAL_COMMUNITY)
Admission: RE | Admit: 2018-04-24 | Discharge: 2018-04-24 | Disposition: A | Discharge status: Home / Self Care | Payer: Managed Care, Other (non HMO) | Source: Ambulatory Visit | Attending: Obstetrics and Gynecology | Admitting: Obstetrics and Gynecology

## 2018-04-24 ENCOUNTER — Other Ambulatory Visit: Payer: Self-pay | Admitting: Obstetrics and Gynecology

## 2018-04-24 DIAGNOSIS — R9389 Abnormal findings on diagnostic imaging of other specified body structures: Secondary | ICD-10-CM

## 2018-04-24 DIAGNOSIS — N6002 Solitary cyst of left breast: Secondary | ICD-10-CM | POA: Diagnosis present

## 2018-04-24 DIAGNOSIS — N63 Unspecified lump in unspecified breast: Secondary | ICD-10-CM

## 2018-04-30 ENCOUNTER — Ambulatory Visit
Admission: RE | Admit: 2018-04-30 | Discharge: 2018-04-30 | Disposition: A | Discharge status: Home / Self Care | Payer: Managed Care, Other (non HMO) | Source: Ambulatory Visit | Attending: Obstetrics and Gynecology | Admitting: Obstetrics and Gynecology

## 2018-04-30 DIAGNOSIS — R9389 Abnormal findings on diagnostic imaging of other specified body structures: Secondary | ICD-10-CM

## 2018-04-30 MED ORDER — GADOBENATE DIMEGLUMINE 529 MG/ML IV SOLN
15.0000 mL | Freq: Once | INTRAVENOUS | Status: AC | PRN
  Administered 2018-04-30: 15 mL via INTRAVENOUS

## 2018-08-25 ENCOUNTER — Other Ambulatory Visit: Payer: Self-pay | Admitting: Obstetrics and Gynecology

## 2018-08-25 DIAGNOSIS — Z1231 Encounter for screening mammogram for malignant neoplasm of breast: Secondary | ICD-10-CM

## 2018-08-28 ENCOUNTER — Ambulatory Visit: Payer: Managed Care, Other (non HMO)

## 2018-10-06 ENCOUNTER — Ambulatory Visit
Admission: RE | Admit: 2018-10-06 | Discharge: 2018-10-06 | Disposition: A | Discharge status: Home / Self Care | Payer: 59 | Source: Ambulatory Visit | Attending: Obstetrics and Gynecology | Admitting: Obstetrics and Gynecology

## 2018-10-06 DIAGNOSIS — Z1231 Encounter for screening mammogram for malignant neoplasm of breast: Secondary | ICD-10-CM

## 2018-10-08 ENCOUNTER — Other Ambulatory Visit: Payer: Self-pay | Admitting: Obstetrics and Gynecology

## 2018-10-08 DIAGNOSIS — R928 Other abnormal and inconclusive findings on diagnostic imaging of breast: Secondary | ICD-10-CM

## 2018-10-10 ENCOUNTER — Ambulatory Visit
Admission: RE | Admit: 2018-10-10 | Discharge: 2018-10-10 | Disposition: A | Discharge status: Home / Self Care | Payer: 59 | Source: Ambulatory Visit | Attending: Obstetrics and Gynecology | Admitting: Obstetrics and Gynecology

## 2018-10-10 ENCOUNTER — Ambulatory Visit: Payer: 59

## 2018-10-10 DIAGNOSIS — R928 Other abnormal and inconclusive findings on diagnostic imaging of breast: Secondary | ICD-10-CM

## 2018-10-20 ENCOUNTER — Other Ambulatory Visit: Payer: Self-pay | Admitting: Obstetrics and Gynecology

## 2018-10-20 DIAGNOSIS — Z803 Family history of malignant neoplasm of breast: Secondary | ICD-10-CM

## 2018-11-08 ENCOUNTER — Ambulatory Visit
Admission: RE | Admit: 2018-11-08 | Discharge: 2018-11-08 | Disposition: A | Discharge status: Home / Self Care | Payer: 59 | Source: Ambulatory Visit | Attending: Obstetrics and Gynecology | Admitting: Obstetrics and Gynecology

## 2018-11-08 DIAGNOSIS — Z803 Family history of malignant neoplasm of breast: Secondary | ICD-10-CM

## 2018-11-08 MED ORDER — GADOBUTROL 1 MMOL/ML IV SOLN
7.0000 mL | Freq: Once | INTRAVENOUS | Status: AC | PRN
  Administered 2018-11-08: 7 mL via INTRAVENOUS

## 2018-11-14 IMAGING — US ULTRASOUND LEFT BREAST LIMITED
1 series · 12 of 17 positions shown · non-contrast
Comparison: Previous exams including recent breast MRI dated
04/19/2018.

CLINICAL DATA: Recent MRI demonstrated a suspicious enhancing mass
within the central LEFT breast for which targeted second-look
ultrasound was recommended. Patient also with an area of increased
enhancement/vascularity within the skin of the upper inner LEFT
breast, likely chronic, for which ultrasound evaluation was also
recommended.

Family history of breast cancer, including mother recently diagnosed
with inflammatory breast cancer.
EXAM:
ULTRASOUND OF THE LEFT BREAST

[Series 1: ultrasound left breast limited · 0.07mm/px · 12 of 17 slices shown]
[im 1/17]
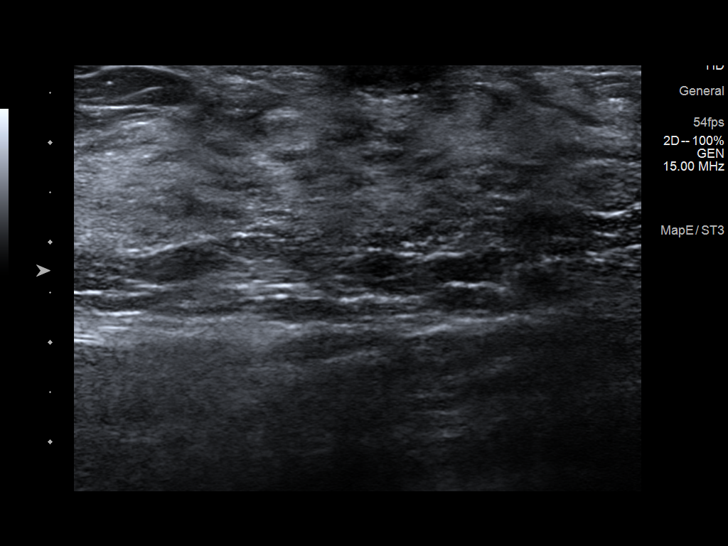
[im 3/17]
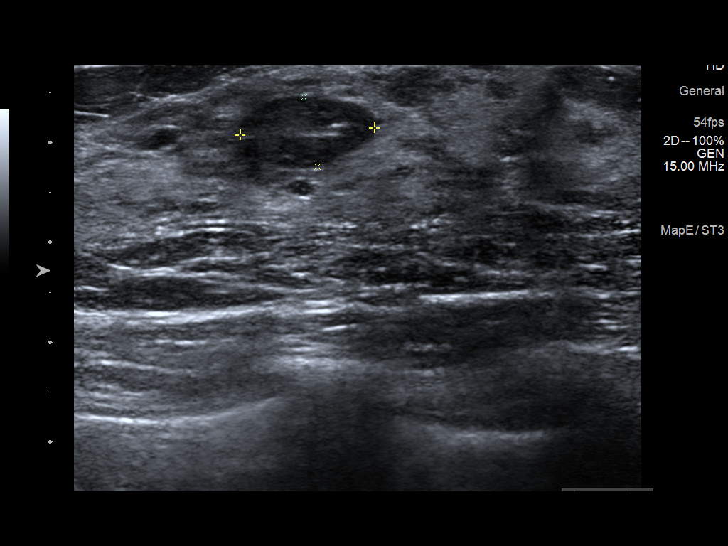
[im 4/17]
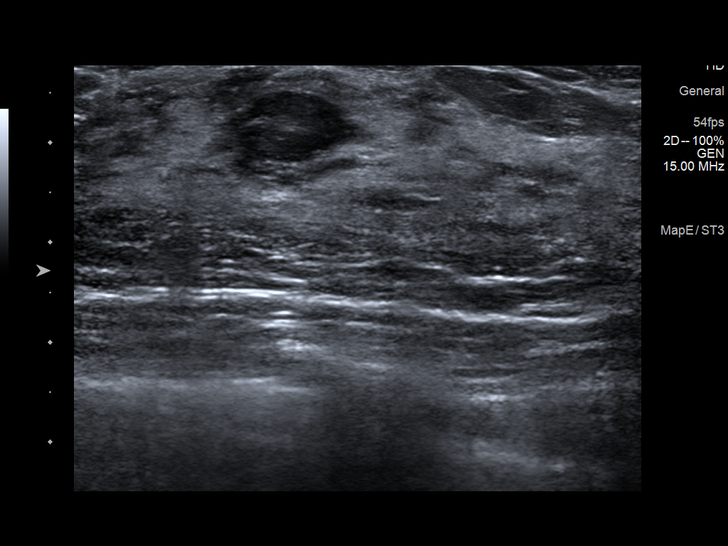
[im 5/17]
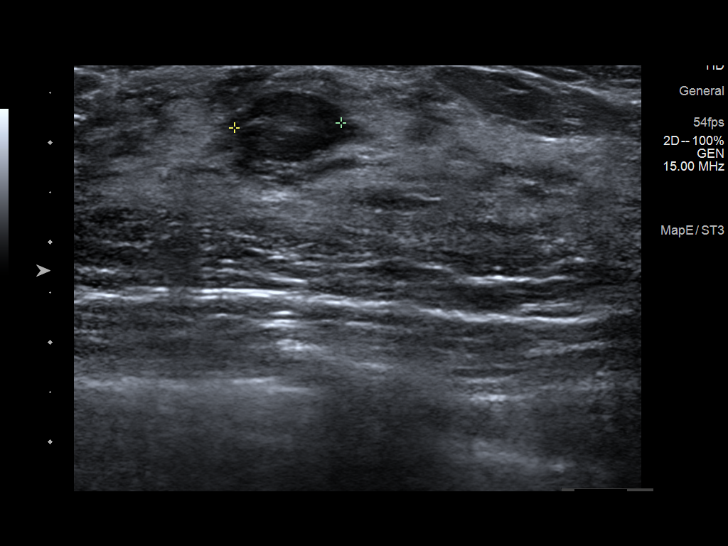
[im 7/17]
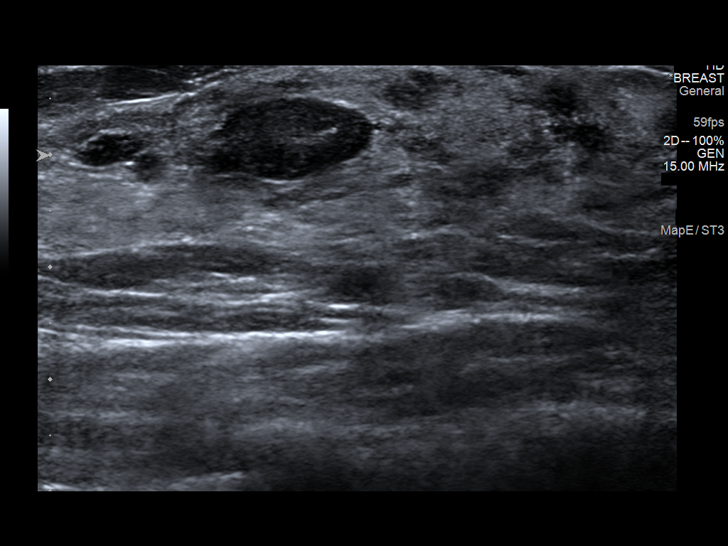
[im 8/17]
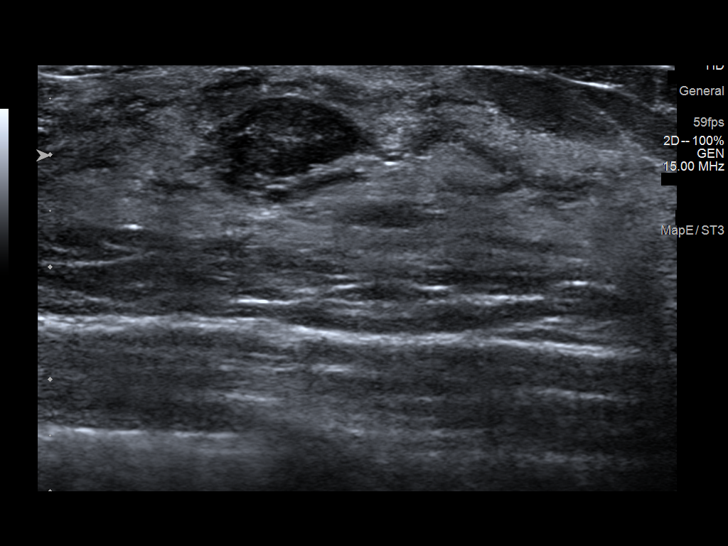
[im 10/17]
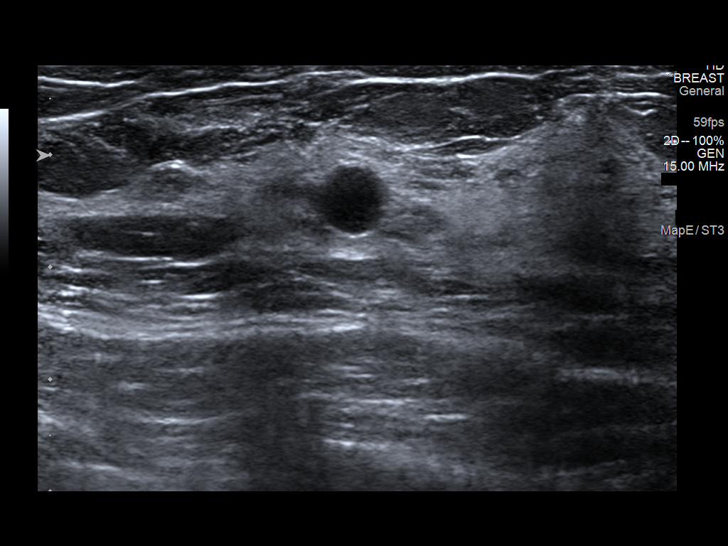
[im 11/17]
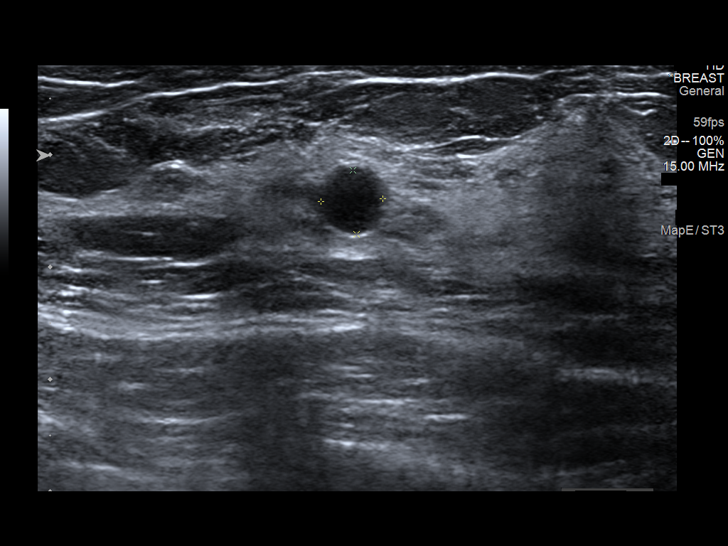
[im 13/17]
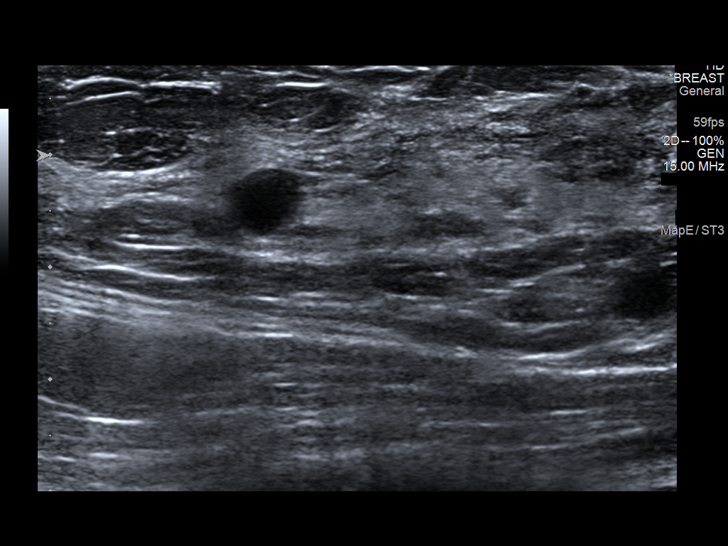
[im 14/17]
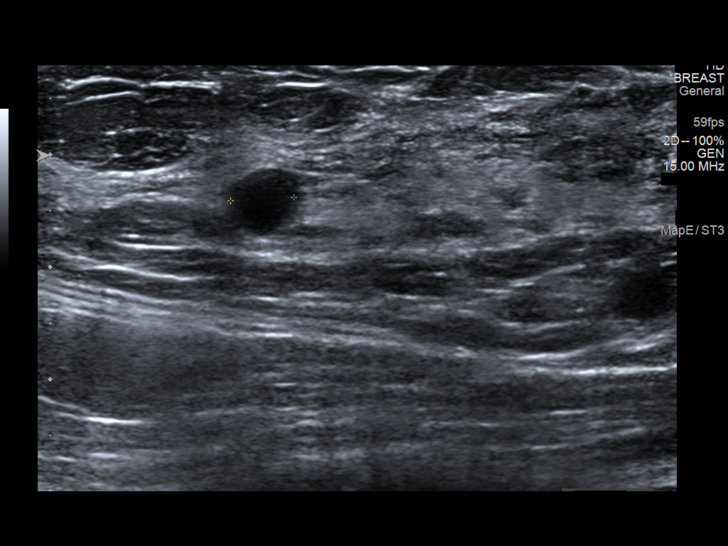
[im 15/17]
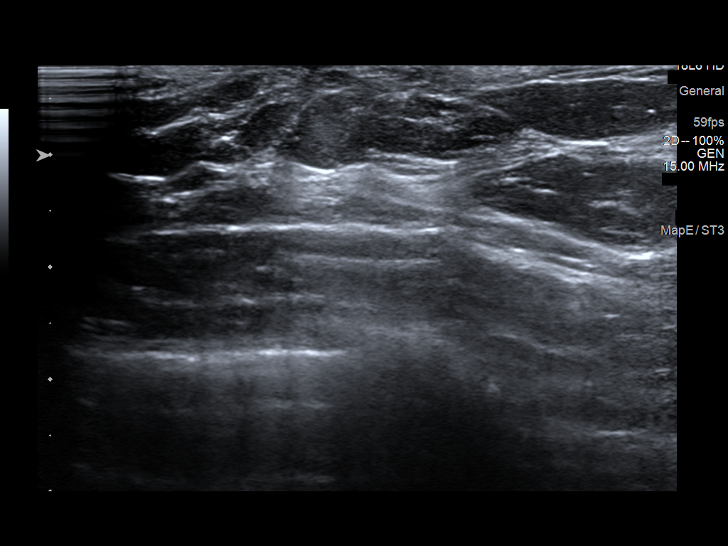
[im 17/17]
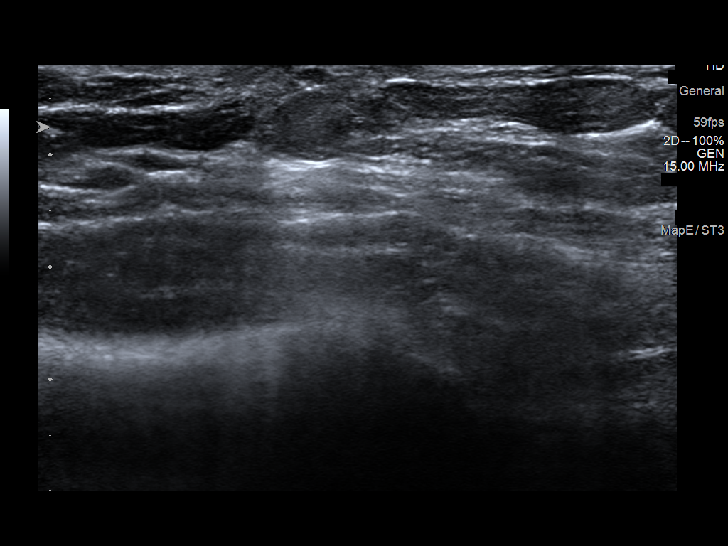

[12 of 17 positions shown; findings below may reference images not displayed]

FINDINGS: Targeted ultrasound is performed, showing an oval circumscribed
hypoechoic mass in the LEFT breast at the 3 o'clock axis, 1 cm from
the nipple, with internal septations, measuring 1.4 x 0.7 x 1.1 cm,
most suggestive of a benign fibroadenoma, not corresponding to the
suspicious MRI finding.

There is an additional nearly anechoic mass in the LEFT breast at
the 12 o'clock axis, 2 cm from the nipple, avascular, measuring 6
mm, most likely a complicated cyst with internal debris.

No suspicious solid or cystic mass is seen within the central LEFT
breast corresponding to the suspicious enhancing mass seen on MRI.

Chronic sebaceous cyst is confirmed within the skin of the LEFT
breast at the 10 o'clock axis, 9 cm from the nipple, measuring
cm extent. Patient indicates previous benign biopsy of the skin
lesion.
IMPRESSION: 1. Probably benign fibroadenoma within the LEFT breast at the 3
o'clock axis, 1 cm from the nipple, measuring 1.4 x 0.7 x 1.1 cm.
This does not correspond to the suspicious enhancing mass seen on
recent MRI. Recommend follow-up ultrasound in 6 months to ensure
stability.
2. Probably benign complicated cyst within the LEFT breast at the 12
o'clock axis, 2 cm from the nipple, measuring 6 mm. This also does
not correspond to the suspicious enhancing mass seen on recent MRI.
Ultrasound-guided aspiration will be performed today to ensure
benignity.
3. Benign chronic sebaceous cyst within the skin of the LEFT breast
at the 10 o'clock axis, measuring 1.2 cm extent, corresponding to
the additional finding on MRI.
4. No ultrasound correlate for the suspicious enhancing mass
identified within the central LEFT breast on recent MRI. MRI guided
biopsy is now recommended.

RECOMMENDATION:
1. MRI-guided biopsy for the 10 mm enhancing mass within the central
LEFT breast.
2. Probably benign complicated cyst within the LEFT breast at the 12
o'clock axis will be aspirated today to ensure benignity.
3. If MRI-guided biopsy were positive for malignancy, would then
recommend ultrasound-guided biopsy of the probably benign
fibroadenoma in the LEFT breast at the 3 o'clock axis to confirm
benignity.

MRI-guided biopsy will be scheduled at patient's earliest
convenience.

I have discussed the findings and recommendations with the patient.
Results were also provided in writing at the conclusion of the
visit. If applicable, a reminder letter will be sent to the patient
regarding the next appointment.

BI-RADS CATEGORY  4: Suspicious.

## 2019-09-14 ENCOUNTER — Other Ambulatory Visit: Payer: Self-pay | Admitting: Obstetrics and Gynecology

## 2019-09-14 DIAGNOSIS — Z1231 Encounter for screening mammogram for malignant neoplasm of breast: Secondary | ICD-10-CM

## 2019-10-29 ENCOUNTER — Ambulatory Visit
Admission: RE | Admit: 2019-10-29 | Discharge: 2019-10-29 | Disposition: A | Discharge status: Home / Self Care | Payer: 59 | Source: Ambulatory Visit | Attending: Obstetrics and Gynecology | Admitting: Obstetrics and Gynecology

## 2019-10-29 ENCOUNTER — Other Ambulatory Visit: Payer: Self-pay

## 2019-10-29 DIAGNOSIS — Z1231 Encounter for screening mammogram for malignant neoplasm of breast: Secondary | ICD-10-CM

## 2019-11-26 ENCOUNTER — Other Ambulatory Visit: Payer: Self-pay | Admitting: Obstetrics and Gynecology

## 2019-11-26 DIAGNOSIS — Z803 Family history of malignant neoplasm of breast: Secondary | ICD-10-CM

## 2020-04-25 ENCOUNTER — Other Ambulatory Visit: Payer: 59

## 2020-05-14 ENCOUNTER — Other Ambulatory Visit: Payer: 59

## 2020-05-21 ENCOUNTER — Other Ambulatory Visit: Payer: 59

## 2020-06-03 ENCOUNTER — Other Ambulatory Visit: Payer: 59

## 2020-06-25 ENCOUNTER — Ambulatory Visit
Admission: RE | Admit: 2020-06-25 | Discharge: 2020-06-25 | Disposition: A | Discharge status: Home / Self Care | Payer: 59 | Source: Ambulatory Visit | Attending: Obstetrics and Gynecology | Admitting: Obstetrics and Gynecology

## 2020-06-25 ENCOUNTER — Other Ambulatory Visit: Payer: Self-pay

## 2020-06-25 DIAGNOSIS — Z803 Family history of malignant neoplasm of breast: Secondary | ICD-10-CM

## 2020-06-25 MED ORDER — GADOBUTROL 1 MMOL/ML IV SOLN
8.0000 mL | Freq: Once | INTRAVENOUS | Status: AC | PRN
  Administered 2020-06-25: 8 mL via INTRAVENOUS

## 2020-06-28 ENCOUNTER — Other Ambulatory Visit: Payer: Self-pay | Admitting: Obstetrics and Gynecology

## 2020-06-28 DIAGNOSIS — R9389 Abnormal findings on diagnostic imaging of other specified body structures: Secondary | ICD-10-CM

## 2020-07-06 ENCOUNTER — Ambulatory Visit
Admission: RE | Admit: 2020-07-06 | Discharge: 2020-07-06 | Disposition: A | Discharge status: Home / Self Care | Payer: 59 | Source: Ambulatory Visit | Attending: Obstetrics and Gynecology | Admitting: Obstetrics and Gynecology

## 2020-07-06 ENCOUNTER — Other Ambulatory Visit (HOSPITAL_COMMUNITY): Payer: Self-pay | Admitting: Diagnostic Radiology

## 2020-07-06 ENCOUNTER — Other Ambulatory Visit: Payer: Self-pay

## 2020-07-06 DIAGNOSIS — R9389 Abnormal findings on diagnostic imaging of other specified body structures: Secondary | ICD-10-CM

## 2020-07-06 MED ORDER — GADOBUTROL 1 MMOL/ML IV SOLN
8.0000 mL | Freq: Once | INTRAVENOUS | Status: AC | PRN
  Administered 2020-07-06: 8 mL via INTRAVENOUS

## 2020-10-06 ENCOUNTER — Other Ambulatory Visit: Payer: Self-pay | Admitting: Obstetrics and Gynecology

## 2020-10-06 DIAGNOSIS — Z1231 Encounter for screening mammogram for malignant neoplasm of breast: Secondary | ICD-10-CM

## 2020-11-21 ENCOUNTER — Ambulatory Visit: Payer: 59

## 2021-01-07 ENCOUNTER — Other Ambulatory Visit: Payer: Self-pay

## 2021-01-07 ENCOUNTER — Ambulatory Visit
Admission: RE | Admit: 2021-01-07 | Discharge: 2021-01-07 | Disposition: A | Discharge status: Home / Self Care | Payer: 59 | Source: Ambulatory Visit | Attending: Obstetrics and Gynecology | Admitting: Obstetrics and Gynecology

## 2021-01-07 DIAGNOSIS — Z1231 Encounter for screening mammogram for malignant neoplasm of breast: Secondary | ICD-10-CM

## 2021-01-10 ENCOUNTER — Ambulatory Visit: Payer: 59

## 2021-01-18 ENCOUNTER — Other Ambulatory Visit: Payer: Self-pay | Admitting: Obstetrics and Gynecology

## 2021-01-18 DIAGNOSIS — Z803 Family history of malignant neoplasm of breast: Secondary | ICD-10-CM

## 2021-03-04 ENCOUNTER — Other Ambulatory Visit: Payer: 59

## 2021-03-18 ENCOUNTER — Other Ambulatory Visit: Payer: 59

## 2021-04-22 ENCOUNTER — Other Ambulatory Visit: Payer: 59

## 2021-12-15 ENCOUNTER — Other Ambulatory Visit: Payer: Self-pay | Admitting: Obstetrics and Gynecology

## 2021-12-15 DIAGNOSIS — Z1231 Encounter for screening mammogram for malignant neoplasm of breast: Secondary | ICD-10-CM

## 2022-01-09 ENCOUNTER — Ambulatory Visit: Payer: 59

## 2022-01-17 ENCOUNTER — Ambulatory Visit
Admission: RE | Admit: 2022-01-17 | Discharge: 2022-01-17 | Disposition: A | Discharge status: Home / Self Care | Payer: 59 | Source: Ambulatory Visit | Attending: Obstetrics and Gynecology | Admitting: Obstetrics and Gynecology

## 2022-01-17 DIAGNOSIS — Z1231 Encounter for screening mammogram for malignant neoplasm of breast: Secondary | ICD-10-CM

## 2022-02-12 ENCOUNTER — Encounter (HOSPITAL_BASED_OUTPATIENT_CLINIC_OR_DEPARTMENT_OTHER): Payer: Self-pay | Admitting: *Deleted

## 2022-02-12 ENCOUNTER — Emergency Department (HOSPITAL_BASED_OUTPATIENT_CLINIC_OR_DEPARTMENT_OTHER)
Admission: EM | Admit: 2022-02-12 | Discharge: 2022-02-12 | Disposition: A | Discharge status: Home / Self Care | Payer: 59 | Attending: Emergency Medicine | Admitting: Emergency Medicine

## 2022-02-12 ENCOUNTER — Other Ambulatory Visit: Payer: Self-pay

## 2022-02-12 DIAGNOSIS — R42 Dizziness and giddiness: Secondary | ICD-10-CM | POA: Diagnosis present

## 2022-02-12 DIAGNOSIS — J0101 Acute recurrent maxillary sinusitis: Secondary | ICD-10-CM | POA: Diagnosis not present

## 2022-02-12 LAB — CBC WITH DIFFERENTIAL/PLATELET
Abs Immature Granulocytes: 0.01 10*3/uL (ref 0.00–0.07)
Basophils Absolute: 0 10*3/uL (ref 0.0–0.1)
Basophils Relative: 1 %
Eosinophils Absolute: 0 10*3/uL (ref 0.0–0.5)
Eosinophils Relative: 1 %
HCT: 37.2 % (ref 36.0–46.0)
Hemoglobin: 12.2 g/dL (ref 12.0–15.0)
Immature Granulocytes: 0 %
Lymphocytes Relative: 58 %
Lymphs Abs: 3.8 10*3/uL (ref 0.7–4.0)
MCH: 27.8 pg (ref 26.0–34.0)
MCHC: 32.8 g/dL (ref 30.0–36.0)
MCV: 84.7 fL (ref 80.0–100.0)
Monocytes Absolute: 0.3 10*3/uL (ref 0.1–1.0)
Monocytes Relative: 5 %
Neutro Abs: 2.2 10*3/uL (ref 1.7–7.7)
Neutrophils Relative %: 35 %
Platelets: 322 10*3/uL (ref 150–400)
RBC: 4.39 MIL/uL (ref 3.87–5.11)
RDW: 13.4 % (ref 11.5–15.5)
WBC: 6.4 10*3/uL (ref 4.0–10.5)
nRBC: 0 % (ref 0.0–0.2)

## 2022-02-12 LAB — BASIC METABOLIC PANEL
Anion gap: 6 (ref 5–15)
BUN: 10 mg/dL (ref 6–20)
CO2: 26 mmol/L (ref 22–32)
Calcium: 8.9 mg/dL (ref 8.9–10.3)
Chloride: 108 mmol/L (ref 98–111)
Creatinine, Ser: 0.78 mg/dL (ref 0.44–1.00)
GFR, Estimated: >60 mL/min (ref >60)
Glucose, Bld: 94 mg/dL (ref 70–99)
Potassium: 3.1 mmol/L — ABNORMAL LOW (ref 3.5–5.1)
Sodium: 140 mmol/L (ref 135–145)

## 2022-02-12 MED ORDER — KETOROLAC TROMETHAMINE 15 MG/ML IJ SOLN
15.0000 mg | Freq: Once | INTRAMUSCULAR | Status: AC
  Administered 2022-02-12: 15 mg via INTRAVENOUS
  Filled 2022-02-12: qty 1

## 2022-02-12 MED ORDER — AMOXICILLIN-POT CLAVULANATE 875-125 MG PO TABS: 1.0000 | ORAL_TABLET | Freq: Two times a day (BID) | ORAL | 0 refills | Status: DC

## 2022-02-12 MED ORDER — AMOXICILLIN-POT CLAVULANATE 875-125 MG PO TABS
1.0000 | ORAL_TABLET | Freq: Once | ORAL | Status: AC
Start: 2022-02-12 — End: 2022-02-12
  Administered 2022-02-12: 1 via ORAL
  Filled 2022-02-12: qty 1

## 2022-02-12 MED ORDER — POTASSIUM CHLORIDE CRYS ER 20 MEQ PO TBCR
40.0000 meq | EXTENDED_RELEASE_TABLET | Freq: Once | ORAL | Status: AC
  Administered 2022-02-12: 40 meq via ORAL
  Filled 2022-02-12: qty 2

## 2022-02-12 MED ORDER — SODIUM CHLORIDE 0.9 % IV BOLUS
1000.0000 mL | Freq: Once | INTRAVENOUS | Status: AC
  Administered 2022-02-12: 1000 mL via INTRAVENOUS

## 2022-02-12 NOTE — ED Notes (Signed)
BEFAST / VAN negative 

## 2022-02-12 NOTE — ED Notes (Addendum)
Ambulated to room, gait very steady, no assistance required. No changes in speech or swallowing, no photosensitivity noted

## 2022-02-12 NOTE — ED Triage Notes (Signed)
For the past 2 days has not felt well, head cold type symptoms, this am stood up and had a dizzy type spell and "fell back on bed"

## 2022-02-12 NOTE — ED Notes (Signed)
Rt arm manual 164/98 Lt arm manual 158/100

## 2022-02-12 NOTE — Discharge Instructions (Signed)
Eat and drink as well as you can for the next 48 hours.  Please follow with your family doctor in the office.  Sudafed is a good medicine to help with congestion but unfortunately it is known that it raises your blood pressure.  This is a concern then you should probably abstain from using that medication or discuss it with your family physician.  I am treating you with antibiotics for possible sinus infection.  Your potassium was mildly low.  I would have you eat a food that is high in potassium with each meal for at least the next week and contact your family doctor for possible recheck.  I did refer you to a neurologist that you are having recurrent headaches and this could be migrainous as opposed to sinus.  They should call you to try and set up an appointment.

## 2022-02-12 NOTE — ED Provider Notes (Signed)
[facility] Provider Note   CSN: [id number] Arrival date & time: 02/12/22  0731     History  Chief Complaint  Patient presents with   Dizziness    Robin Wheeler is a 56 y.o. female.  56 yo F with a chief complaints of headache.  This been going on for couple days now.  She gets headaches like this typically during allergy season.  Has been taking Sudafed with some improvement.  She has been checking her blood pressure and noted that has been elevated and she is felt a little bit lightheaded.  She stood up this morning and felt a bit more lightheaded felt her vision to him and she collapsed onto her bed.  She does not think that she fully lost consciousness.  Her headache is significantly better today than it has been.  She denies any chest pain or difficulty breathing denies neck pain.  Denies trauma.  Denies one-sided numbness or weakness denies difficulty with speech or swallowing.   Dizziness     Home Medications Prior to Admission medications   Medication Sig Start Date End Date Taking? Authorizing Provider  amoxicillin-clavulanate (AUGMENTIN) 875-125 MG tablet Take 1 tablet by mouth every 12 (twelve) hours. 02/12/22  Yes Deno Etienne, DO  cholecalciferol (VITAMIN D) 1000 UNITS tablet Take 1,000 Units by mouth daily.    [provider]  omega-3 acid ethyl esters (LOVAZA) 1 G capsule Take 2 g by mouth daily.    [provider]  vitamin C (ASCORBIC ACID) 500 MG tablet Take 500 mg by mouth daily.    [provider]      Allergies    Vicodin [hydrocodone-acetaminophen]    Review of Systems   Review of Systems  Neurological:  Positive for dizziness.   Physical Exam Updated Vital Signs BP (!) 160/90 (BP Location: Right Arm)   Pulse 86   Temp 97.9 F (36.6 C) (Oral)   Resp 14   Ht '5\' 11"'$  (1.803 m)   Wt 73.5 kg   SpO2 100%   BMI 22.60 kg/m  Physical Exam Vitals and nursing note reviewed.  Constitutional:       General: She is not in acute distress.    Appearance: She is well-developed. She is not diaphoretic.  HENT:     Head: Normocephalic and atraumatic.     Comments: No obvious congestion or intraoral pathology but is exquisitely tender to percussion over the right maxillary sinus. Eyes:     Pupils: Pupils are equal, round, and reactive to light.  Cardiovascular:     Rate and Rhythm: Normal rate and regular rhythm.     Heart sounds: No murmur heard.   No friction rub. No gallop.  Pulmonary:     Effort: Pulmonary effort is normal.     Breath sounds: No wheezing or rales.  Abdominal:     General: There is no distension.     Palpations: Abdomen is soft.     Tenderness: There is no abdominal tenderness.  Musculoskeletal:        General: No tenderness.     Cervical back: Normal range of motion and neck supple.  Skin:    General: Skin is warm and dry.  Neurological:     Mental Status: She is alert and oriented to person, place, and time.     GCS: GCS eye subscore is 4. GCS verbal subscore is 5. GCS motor subscore is 6.     Cranial Nerves: Cranial nerves  2-12 are intact.     Sensory: Sensation is intact.     Motor: Motor function is intact.     Coordination: Coordination is intact.     Comments: Benign neurologic exam.  Ambulates without issue.  No nystagmus.  Psychiatric:        Behavior: Behavior normal.    ED Results / Procedures / Treatments   Labs (all labs ordered are listed, but only abnormal results are displayed) Labs Reviewed  BASIC METABOLIC PANEL - Abnormal; Notable for the following components:      Result Value   Potassium 3.1 (*)    All other components within normal limits  CBC WITH DIFFERENTIAL/PLATELET    EKG EKG Interpretation  Date/Time:  Monday Feb 12 2022 08:06:02 EDT Ventricular Rate:  70 PR Interval:  173 QRS Duration: 79 QT Interval:  414 QTC Calculation: 447 R Axis:   53 Text Interpretation: Sinus rhythm Consider left ventricular hypertrophy no  wpw, prolonged qt or brugada No old tracing to compare Confirmed by Deno Etienne [phone]) on 02/12/2022 8:13:25 AM  Radiology No results found.  Procedures .1-3 Lead EKG Interpretation Performed by: Deno Etienne, DO Authorized by: Deno Etienne, DO     Interpretation: normal     ECG rate:  85   ECG rate assessment: normal     Rhythm: sinus rhythm     Conduction: normal      Medications Ordered in ED Medications  sodium chloride 0.9 % bolus 1,000 mL (1,000 mLs Intravenous New Bag/Given 02/12/22 0812)  ketorolac (TORADOL) 15 MG/ML injection 15 mg (15 mg Intravenous Given 02/12/22 0813)  amoxicillin-clavulanate (AUGMENTIN) 875-125 MG per tablet 1 tablet (1 tablet Oral Given 02/12/22 0813)  potassium chloride SA (KLOR-CON M) CR tablet 40 mEq (40 mEq Oral Given 02/12/22 6629)    ED Course/ Medical Decision Making/ A&P                           Medical Decision Making Amount and/or Complexity of Data Reviewed Labs: ordered. ECG/medicine tests: ordered.  Risk Prescription drug management.   56 yo F with a chief complaints of headache.  These apparently are recurrent for her.  Usually occur about the right eye.  She typically treats them at home with Excedrin Migraine and Sudafed.  Has been doing this for the past couple days.  Her headache is gotten a bit better this morning but when she stood up to get ready for the day she felt a bit lightheaded and collapsed onto the bed.  Could be orthostasis based on history or vasovagal.  She has a very mild headache now.  Has a benign neurologic exam.  Will obtain basic blood work.  Bolus of IV fluids.  Treat pain.  With her complaining of sinus pain will start on antibiotics with maxillary sinus tender to percussion.  Could be also recurrent migraines as they are one-sided.  Will refer to neurology.  Patient feeling a bit better on reassessment.  Lab work without anemia mild hypokalemia.  Given an oral dose of replacement here.  No ectopy on telemetry  monitoring.  Will discharge home.  9:08 AM:  I have discussed the diagnosis/risks/treatment options with the patient and family.  Evaluation and diagnostic testing in the emergency department does not suggest an emergent condition requiring admission or immediate intervention beyond what has been performed at this time.  They will follow up with  PCP. We also discussed returning to the ED  immediately if new or worsening sx occur. We discussed the sx which are most concerning (e.g., sudden worsening pain, fever, inability to tolerate by mouth) that necessitate immediate return. Medications administered to the patient during their visit and any new prescriptions provided to the patient are listed below.  Medications given during this visit Medications  sodium chloride 0.9 % bolus 1,000 mL (1,000 mLs Intravenous New Bag/Given 02/12/22 0812)  ketorolac (TORADOL) 15 MG/ML injection 15 mg (15 mg Intravenous Given 02/12/22 0813)  amoxicillin-clavulanate (AUGMENTIN) 875-125 MG per tablet 1 tablet (1 tablet Oral Given 02/12/22 0813)  potassium chloride SA (KLOR-CON M) CR tablet 40 mEq (40 mEq Oral Given 02/12/22 0858)     The patient appears reasonably screen and/or stabilized for discharge and I doubt any other medical condition or other [facility] requiring further screening, evaluation, or treatment in the ED at this time prior to discharge.          Final Clinical Impression(s) / ED Diagnoses Final diagnoses:  Acute recurrent maxillary sinusitis    Rx / DC Orders ED Discharge Orders          Ordered    Ambulatory referral to Neurology       Comments: Recurrent right sided headaches. Migraines?   02/12/22 0758    amoxicillin-clavulanate (AUGMENTIN) 875-125 MG tablet  Every 12 hours        02/12/22 [place], [place], DO 02/12/22 [phone]

## 2022-05-17 ENCOUNTER — Other Ambulatory Visit: Payer: Self-pay | Admitting: Obstetrics and Gynecology

## 2022-05-18 ENCOUNTER — Other Ambulatory Visit: Payer: Self-pay | Admitting: Obstetrics and Gynecology

## 2022-05-18 DIAGNOSIS — Z9189 Other specified personal risk factors, not elsewhere classified: Secondary | ICD-10-CM

## 2022-08-09 IMAGING — MG MM DIGITAL SCREENING BILAT W/ TOMO AND CAD
8 series · 9 of 24 positions shown · non-contrast
Comparison: Previous exam(s).

CLINICAL DATA: Screening.

EXAM:
DIGITAL SCREENING BILATERAL MAMMOGRAM WITH TOMOSYNTHESIS AND CAD
TECHNIQUE: Bilateral screening digital craniocaudal and mediolateral oblique
mammograms were obtained. Bilateral screening digital breast
tomosynthesis was performed. The images were evaluated with
computer-aided detection.

[L CC synth-2D]
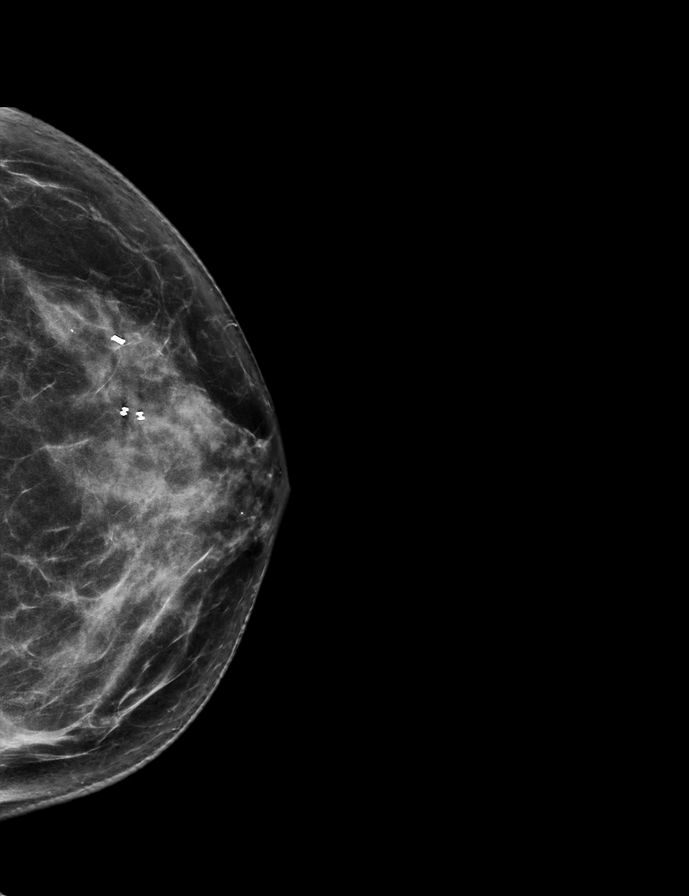

[R CC synth-2D]
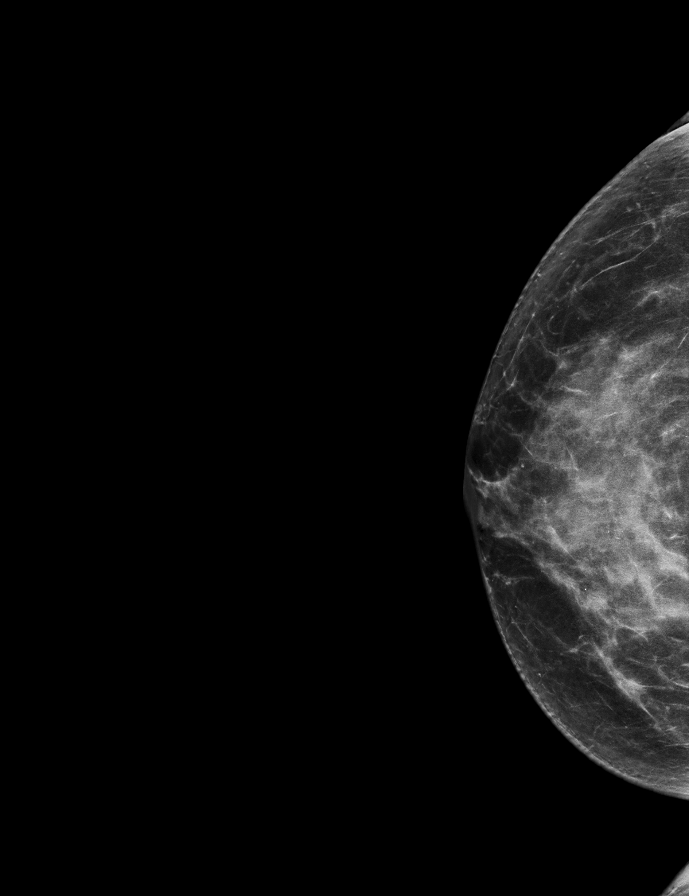

[R MLO synth-2D]
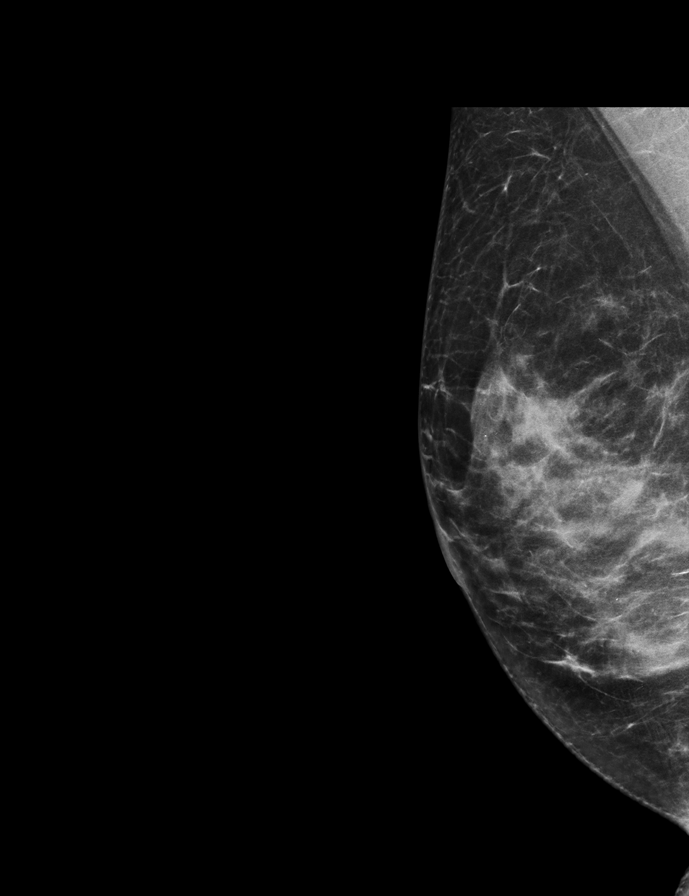

[L MLO synth-2D]
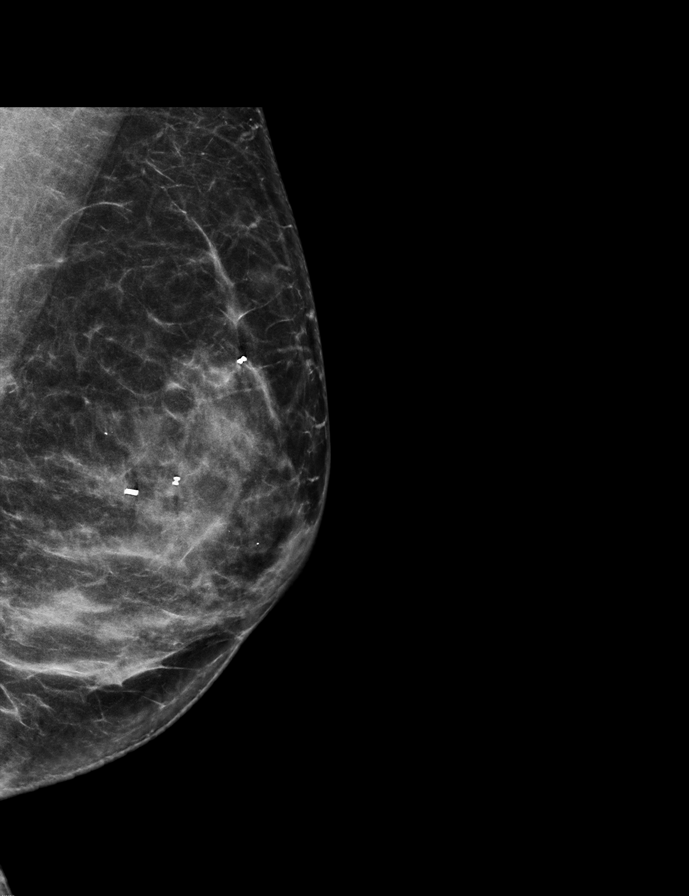

[L CC tomo · 2 of 67 frames shown]
[frame 22/67]
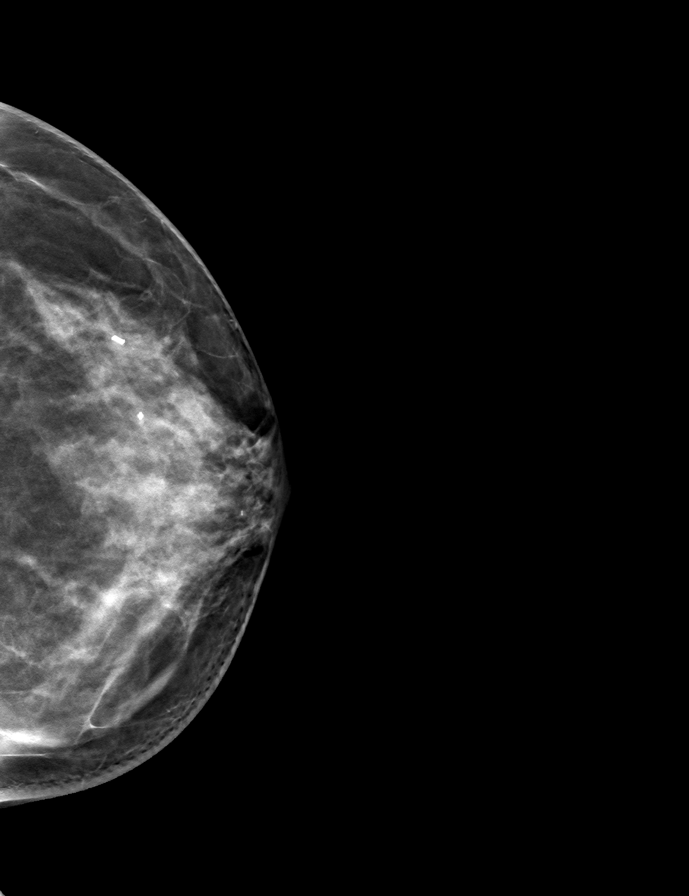
[frame 34/67]
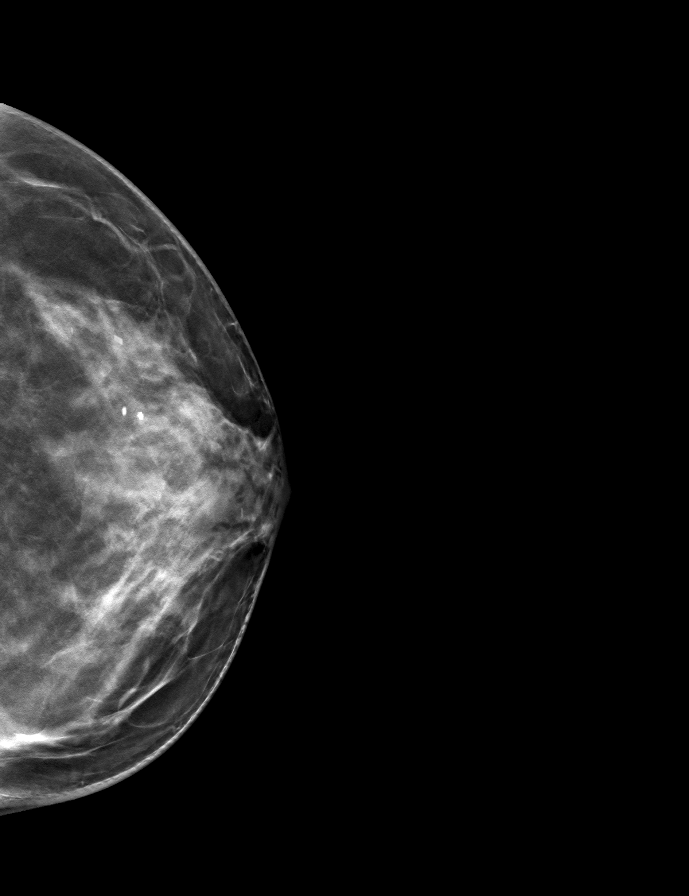

[R MLO tomo · tomo slice 33/65.0]
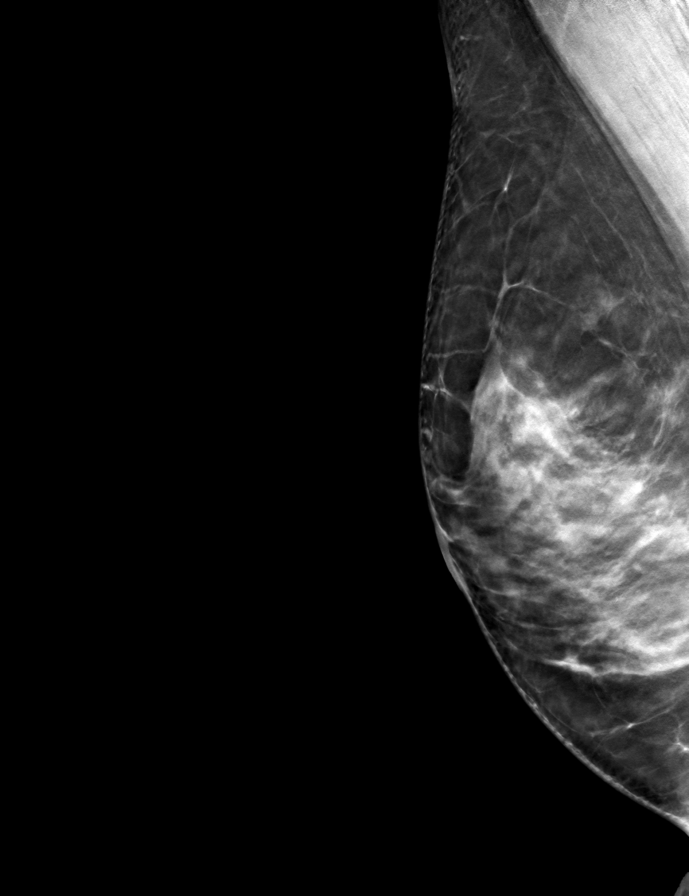

[L MLO tomo · tomo slice 34/67.0]
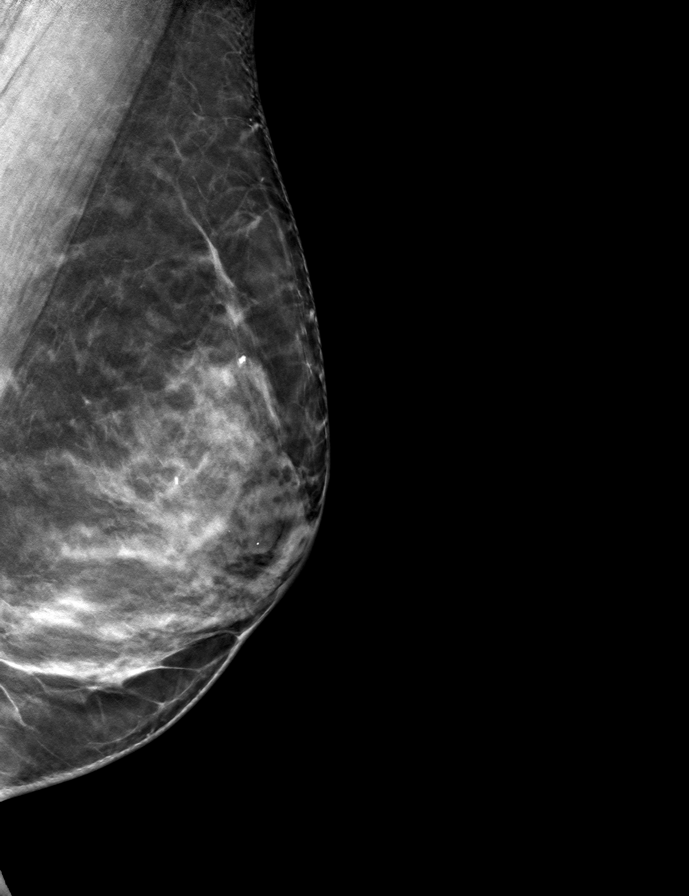

[R CC tomo · tomo slice 35/69.0]
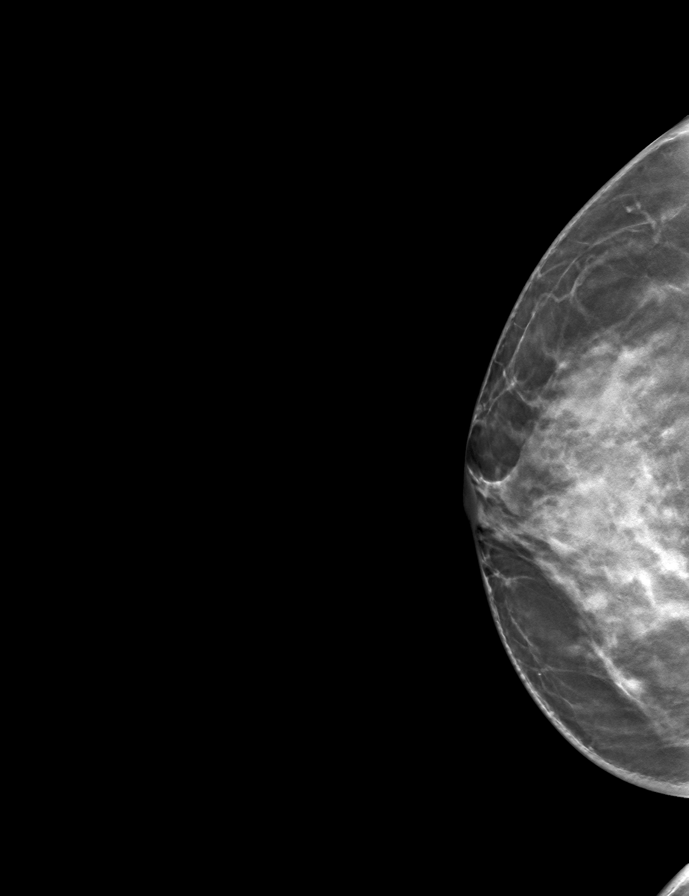

[9 of 24 positions shown; findings below may reference images not displayed]

ACR Breast Density Category c: The breast tissue is heterogeneously
dense, which may obscure small masses.
FINDINGS: There are no findings suspicious for malignancy.
IMPRESSION: No mammographic evidence of malignancy. A result letter of this
screening mammogram will be mailed directly to the patient.

RECOMMENDATION:
Screening mammogram in one year. (Code:Q3-W-BC3)

BI-RADS CATEGORY  1: Negative.

## 2022-12-05 ENCOUNTER — Other Ambulatory Visit: Payer: Self-pay | Admitting: Obstetrics and Gynecology

## 2022-12-05 DIAGNOSIS — Z1231 Encounter for screening mammogram for malignant neoplasm of breast: Secondary | ICD-10-CM

## 2023-01-21 ENCOUNTER — Ambulatory Visit
Admission: RE | Admit: 2023-01-21 | Discharge: 2023-01-21 | Disposition: A | Discharge status: Home / Self Care | Payer: Managed Care, Other (non HMO) | Source: Ambulatory Visit | Attending: Obstetrics and Gynecology | Admitting: Obstetrics and Gynecology

## 2023-01-21 DIAGNOSIS — Z1231 Encounter for screening mammogram for malignant neoplasm of breast: Secondary | ICD-10-CM

## 2023-08-02 ENCOUNTER — Other Ambulatory Visit: Payer: Self-pay | Admitting: Obstetrics and Gynecology

## 2023-08-02 DIAGNOSIS — Z9189 Other specified personal risk factors, not elsewhere classified: Secondary | ICD-10-CM

## 2023-09-11 ENCOUNTER — Encounter: Payer: Self-pay | Admitting: Obstetrics and Gynecology

## 2023-09-20 ENCOUNTER — Ambulatory Visit
Admission: RE | Admit: 2023-09-20 | Discharge: 2023-09-20 | Disposition: A | Discharge status: Home / Self Care | Payer: Managed Care, Other (non HMO) | Source: Ambulatory Visit | Attending: Obstetrics and Gynecology | Admitting: Obstetrics and Gynecology

## 2023-09-20 DIAGNOSIS — Z9189 Other specified personal risk factors, not elsewhere classified: Secondary | ICD-10-CM

## 2023-09-20 MED ORDER — GADOPICLENOL 0.5 MMOL/ML IV SOLN
7.0000 mL | Freq: Once | INTRAVENOUS | Status: AC | PRN
  Administered 2023-09-20: 7 mL via INTRAVENOUS

## 2023-12-25 ENCOUNTER — Other Ambulatory Visit: Payer: Self-pay | Admitting: Obstetrics and Gynecology

## 2023-12-25 DIAGNOSIS — Z1231 Encounter for screening mammogram for malignant neoplasm of breast: Secondary | ICD-10-CM

## 2024-01-16 ENCOUNTER — Telehealth: Payer: Self-pay | Admitting: Internal Medicine

## 2024-01-16 NOTE — Telephone Encounter (Signed)
 Good afternoon Dr. Bridgett Camps DOD PM 4/24  We received a referral for this patient to be scheduled for a colonoscopy. Patient last had a colonoscopy in 06/2019 with Dr. Andriette Keeling. Since then, Dr. Andriette Keeling has retired and patient would like to schedule with our practice. Records from initial procedure were obtained and scanned into Media for you to review. Would you please advise on scheduling?  Thank you.

## 2024-01-16 NOTE — Telephone Encounter (Signed)
 Ok for colonoscopy in [facility] for hx of polyps, last exam 2020

## 2024-01-28 ENCOUNTER — Ambulatory Visit
Admission: RE | Admit: 2024-01-28 | Discharge: 2024-01-28 | Disposition: A | Discharge status: Home / Self Care | Source: Ambulatory Visit | Attending: Obstetrics and Gynecology | Admitting: Obstetrics and Gynecology

## 2024-01-28 DIAGNOSIS — Z1231 Encounter for screening mammogram for malignant neoplasm of breast: Secondary | ICD-10-CM

## 2024-01-30 NOTE — Telephone Encounter (Signed)
 Left voicemail for patient to call back and discuss scheduling

## 2024-04-28 ENCOUNTER — Encounter: Payer: Self-pay | Admitting: Gastroenterology

## 2024-05-27 ENCOUNTER — Ambulatory Visit (AMBULATORY_SURGERY_CENTER)

## 2024-05-27 VITALS — Ht 70.0 in | Wt 170.0 lb

## 2024-05-27 DIAGNOSIS — Z860100 Personal history of colon polyps, unspecified: Secondary | ICD-10-CM

## 2024-05-27 MED ORDER — SUTAB 1479-225-188 MG PO TABS: ORAL_TABLET | ORAL | 0 refills | Status: DC

## 2024-05-27 NOTE — Progress Notes (Signed)
 No issues known to pt with past sedation with any surgeries or procedures Patient denies ever being told they had issues or difficulty with intubation  No FH of Malignant Hyperthermia Pt is not on diet pills nor GLP-1 medications Pt is not on home 02  Pt is not on blood thinners  Pt denies issues with chronic constipation  No A fib or A flutter Have any cardiac testing pending--no Pt instructed to use Single[employer]com or GoodRx for a price reduction on prep  Ambulates independently Patient states having a very difficult time consuming liquid prep medication; RN explained importance of drinking plenty of water as instructed in the prep instructions so that Sutab  will effectively clean out colon. Patient stated understanding.

## 2024-05-28 ENCOUNTER — Encounter: Payer: Self-pay | Admitting: Gastroenterology

## 2024-05-29 ENCOUNTER — Encounter: Payer: Self-pay | Admitting: Gastroenterology

## 2024-05-29 ENCOUNTER — Telehealth: Payer: Self-pay

## 2024-05-29 MED ORDER — NA SULFATE-K SULFATE-MG SULF 17.5-3.13-1.6 GM/177ML PO SOLN: 1.0000 | Freq: Once | ORAL | 0 refills | Status: AC

## 2024-05-29 NOTE — Telephone Encounter (Signed)
 Returned call to patient who was requesting liquid prep instead of tablets. RN confirmed pharmacy. Prescription per MD order has been sent to pharmacy. RN told patient that new instructions will be sent to patient via My Chart. Patient stated understanding.

## 2024-06-08 ENCOUNTER — Encounter: Payer: Self-pay | Admitting: Gastroenterology

## 2024-06-08 ENCOUNTER — Ambulatory Visit (AMBULATORY_SURGERY_CENTER): Admitting: Gastroenterology

## 2024-06-08 VITALS — BP 147/81 | HR 64 | Temp 97.3°F | Resp 13 | Ht 70.0 in | Wt 170.0 lb

## 2024-06-08 DIAGNOSIS — K635 Polyp of colon: Secondary | ICD-10-CM

## 2024-06-08 DIAGNOSIS — D125 Benign neoplasm of sigmoid colon: Secondary | ICD-10-CM

## 2024-06-08 DIAGNOSIS — K648 Other hemorrhoids: Secondary | ICD-10-CM | POA: Diagnosis not present

## 2024-06-08 DIAGNOSIS — Z1211 Encounter for screening for malignant neoplasm of colon: Secondary | ICD-10-CM | POA: Diagnosis present

## 2024-06-08 DIAGNOSIS — D122 Benign neoplasm of ascending colon: Secondary | ICD-10-CM

## 2024-06-08 DIAGNOSIS — Z860100 Personal history of colon polyps, unspecified: Secondary | ICD-10-CM

## 2024-06-08 DIAGNOSIS — K641 Second degree hemorrhoids: Secondary | ICD-10-CM

## 2024-06-08 MED ORDER — SODIUM CHLORIDE 0.9 % IV SOLN: 500.0000 mL | Freq: Once | INTRAVENOUS | Status: DC

## 2024-06-08 NOTE — Progress Notes (Signed)
 BP recheck 153/89

## 2024-06-08 NOTE — Progress Notes (Signed)
 Called to room to assist during endoscopic procedure.  Patient ID and intended procedure confirmed with present staff. Received instructions for my participation in the procedure from the performing physician.

## 2024-06-08 NOTE — Progress Notes (Signed)
 Sedate, gd SR, tolerated procedure well, VSS, report to RN

## 2024-06-08 NOTE — Patient Instructions (Signed)
 YOU HAD AN ENDOSCOPIC PROCEDURE TODAY AT THE [facility]:   Refer to the procedure report that was given to you for any specific questions about what was found during the examination.  If the procedure report does not answer your questions, please call your gastroenterologist to clarify.  If you requested that your care partner not be given the details of your procedure findings, then the procedure report has been included in a sealed envelope for you to review at your convenience later.  YOU SHOULD EXPECT: Some feelings of bloating in the abdomen. Passage of more gas than usual.  Walking can help get rid of the air that was put into your GI tract during the procedure and reduce the bloating. If you had a lower endoscopy (such as a colonoscopy or flexible sigmoidoscopy) you may notice spotting of blood in your stool or on the toilet paper. If you underwent a bowel prep for your procedure, you may not have a normal bowel movement for a few days.  Please Note:  You might notice some irritation and congestion in your nose or some drainage.  This is from the oxygen used during your procedure.  There is no need for concern and it should clear up in a day or so.  SYMPTOMS TO REPORT IMMEDIATELY:  Following lower endoscopy (colonoscopy or flexible sigmoidoscopy):  Excessive amounts of blood in the stool  Significant tenderness or worsening of abdominal pains  Swelling of the abdomen that is new, acute  Fever of 100F or higher  Resume previous diet Continue present medications Await pathology results Return to GI clinic as needed  For urgent or emergent issues, a gastroenterologist can be reached at any hour by calling [phone]. Do not use MyChart messaging for urgent concerns.    DIET:  We do recommend a small meal at first, but then you may proceed to your regular diet.  Drink plenty of fluids but you should avoid alcoholic beverages for 24 hours.  ACTIVITY:  You should plan to  take it easy for the rest of today and you should NOT DRIVE or use heavy machinery until tomorrow (because of the sedation medicines used during the test).    FOLLOW UP: Our staff will call the number listed on your records the next business day following your procedure.  We will call around 7:15- 8:00 am to check on you and address any questions or concerns that you may have regarding the information given to you following your procedure. If we do not reach you, we will leave a message.     If any biopsies were taken you will be contacted by phone or by letter within the next 1-3 weeks.  Please call us  at [phone] if you have not heard about the biopsies in 3 weeks.    SIGNATURES/CONFIDENTIALITY: You and/or your care partner have signed paperwork which will be entered into your electronic medical record.  These signatures attest to the fact that that the information above on your After Visit Summary has been reviewed and is understood.  Full responsibility of the confidentiality of this discharge information lies with you and/or your care-partner.

## 2024-06-08 NOTE — Progress Notes (Signed)
 GASTROENTEROLOGY PROCEDURE H&P NOTE   Primary Care Physician: Millicent Sharper, MD    Reason for Procedure:  Colon polyp surveillance  Plan:    Colonoscopy  Patient is appropriate for endoscopic procedure(s) in the ambulatory ([facility]) setting.  The nature of the procedure, as well as the risks, benefits, and alternatives were carefully and thoroughly reviewed with the patient. Ample time for discussion and questions allowed. The patient understood, was satisfied, and agreed to proceed.     HPI: Robin Wheeler is a 58 y.o. female who presents for colonoscopy for ongoing colon polyp surveillance and colon cancer screening.  No active GI symptoms.  No known family history of colon cancer or related malignancy.  Patient is otherwise without complaints or active issues today.  Last colonoscopy was 06/2019 at outside facility and notable for polyps per patient (no endoscopy or pathology reports available for review.   History reviewed. No pertinent past medical history.  Past Surgical History:  Procedure Laterality Date   BREAST BIOPSY Left 2019   PASH   ENDOMETRIAL ABLATION     WISDOM TOOTH EXTRACTION      Prior to Admission medications   Medication Sig Start Date End Date Taking? Authorizing Provider  cholecalciferol (VITAMIN D) 1000 UNITS tablet Take 1,000 Units by mouth daily.    [provider]  Collagen-Vitamin C-Biotin (COLLAGEN 1500/C PO) Take by mouth.    [provider]  fluticasone (FLONASE) 50 MCG/ACT nasal spray Place 1 spray into the nose daily as needed. 08/06/22   [provider]  omega-3 acid ethyl esters (LOVAZA) 1 G capsule Take 2 g by mouth daily. Patient not taking: Reported on 05/27/2024    [provider]  vitamin C (ASCORBIC ACID) 500 MG tablet Take 500 mg by mouth daily. Patient not taking: Reported on 05/27/2024    [provider]    Current Outpatient Medications  Medication Sig Dispense Refill    cholecalciferol (VITAMIN D) 1000 UNITS tablet Take 1,000 Units by mouth daily.     Collagen-Vitamin C-Biotin (COLLAGEN 1500/C PO) Take by mouth.     fluticasone (FLONASE) 50 MCG/ACT nasal spray Place 1 spray into the nose daily as needed.     omega-3 acid ethyl esters (LOVAZA) 1 G capsule Take 2 g by mouth daily. (Patient not taking: Reported on 05/27/2024)     vitamin C (ASCORBIC ACID) 500 MG tablet Take 500 mg by mouth daily. (Patient not taking: Reported on 05/27/2024)     Current Facility-Administered Medications  Medication Dose Route Frequency Provider Last Rate Last Admin   0.9 %  sodium chloride  infusion  500 mL Intravenous Once Leam Madero V, DO        Allergies as of 06/08/2024 - Review Complete 06/08/2024  Allergen Reaction Noted   Miralax [polyethylene glycol] Nausea And Vomiting 05/27/2024   Vicodin [hydrocodone-acetaminophen] Nausea And Vomiting 02/12/2012   Hydrocodone-acetaminophen Palpitations 06/08/2024   Levonorgestrel-ethinyl estrad Other (See Comments) 06/08/2024   Oxycodone hcl Nausea Only 06/08/2024    Family History  Problem Relation Age of Onset   Breast cancer Mother 74   Stomach cancer Maternal Grandmother    Breast cancer Maternal Grandmother 49   Breast cancer Maternal Grandfather    Colon cancer Neg Hx    Esophageal cancer Neg Hx    Rectal cancer Neg Hx    Colon polyps Neg Hx     Social History   Socioeconomic History   Marital status: Married    Spouse name: Not on  file   Number of children: Not on file   Years of education: Not on file   Highest education level: Not on file  Occupational History   Not on file  Tobacco Use   Smoking status: Never   Smokeless tobacco: Never  Vaping Use   Vaping status: Never Used  Substance and Sexual Activity   Alcohol use: Yes    Comment: only on rare social occasions, one drink   Drug use: No   Sexual activity: Never  Other Topics Concern   Not on file  Social History Narrative   Not on file    Social Drivers of Health   Financial Resource Strain: Patient Declined (03/28/2022)   Received from [facility]   Overall Financial Resource Strain (CARDIA)    Difficulty of Paying Living Expenses: Patient declined  Food Insecurity: Low Risk  (01/24/2023)   Received from [facility]   Hunger Vital Sign    Within the past 12 months, you worried that your food would run out before you got money to buy more: Never true    Within the past 12 months, the food you bought just didn't last and you didn't have money to get more. : Never true  Transportation Needs: Not on file (01/24/2023)  Physical Activity: Sufficiently Active (03/28/2022)   Received from [facility] visits prior to 11/24/2022., [facility]   Exercise Vital Sign    On average, how many days per week do you engage in moderate to strenuous exercise (like a brisk walk)?: 5 days    On average, how many minutes do you engage in exercise at this level?: 60 min  Stress: Stress Concern Present (03/28/2022)   Received from [facility] visits prior to 11/24/2022., [facility]   [employer] of Occupational Health - Occupational Stress Questionnaire    Feeling of Stress : To some extent  Social Connections: Unknown (03/28/2022)   Received from [facility] visits prior to 11/24/2022., [facility]   Social Connection and Isolation Panel    In a typical week, how many times do you talk on the phone with family, friends, or neighbors?: Patient declined    How often do you get together with friends or relatives?: Patient declined    How often do you attend church or religious services?: Patient declined    Do you belong to any clubs or organizations such as church groups, unions, fraternal or athletic groups, or school groups?: Patient declined    How often do you attend meetings of the clubs or organizations you belong to?: Patient declined    Are you married, widowed,  divorced, separated, never married, or living with a partner?: Married  Intimate Partner Violence: Not on file    Physical Exam: Vital signs in last 24 hours: @BP  (!) 151/101   Pulse 81   Temp (!) 97.3 F (36.3 C) (Skin)   Ht 5' 10 (1.778 m)   Wt 170 lb (77.1 kg)   SpO2 98%   BMI 24.39 kg/m  GEN: NAD EYE: Sclerae anicteric ENT: MMM CV: Non-tachycardic Pulm: CTA b/l GI: Soft, NT/ND NEURO:  Alert & Oriented x 3   Sandor Flatter, DO [facility]   06/08/2024 8:01 AM

## 2024-06-08 NOTE — Op Note (Signed)
 [facility] Patient Name: Robin Wheeler Procedure Date: 06/08/2024 8:16 AM MRN: [id number] Endoscopist: Sandor Flatter , MD, [id number] Age: 58 Referring MD:  Date of Birth: 1966-08-13 Gender: Female Account #: [id number] Procedure:                Colonoscopy Indications:              Surveillance: Personal history of colonic polyps                            (unknown histology) on last colonoscopy 5 years ago Medicines:                Monitored Anesthesia Care Procedure:                Pre-Anesthesia Assessment:                           - Prior to the procedure, a History and Physical                            was performed, and patient medications and                            allergies were reviewed. The patient's tolerance of                            previous anesthesia was also reviewed. The risks                            and benefits of the procedure and the sedation                            options and risks were discussed with the patient.                            All questions were answered, and informed consent                            was obtained. Prior Anticoagulants: The patient has                            taken no anticoagulant or antiplatelet agents. ASA                            Grade Assessment: I - A normal, healthy patient.                            After reviewing the risks and benefits, the patient                            was deemed in satisfactory condition to undergo the                            procedure.  After obtaining informed consent, the colonoscope                            was passed under direct vision. Throughout the                            procedure, the patient's blood pressure, pulse, and                            oxygen saturations were monitored continuously. The                            CF [id number] #[id number] was introduced through the anus                            and advanced to  the the cecum, identified by                            appendiceal orifice and ileocecal valve. The                            colonoscopy was performed without difficulty. The                            patient tolerated the procedure well. The quality                            of the bowel preparation was good. The ileocecal                            valve, appendiceal orifice, and rectum were                            photographed. Scope In: 8:34:01 AM Scope Out: 8:53:52 AM Scope Withdrawal Time: 0 hours 13 minutes 19 seconds  Total Procedure Duration: 0 hours 19 minutes 51 seconds  Findings:                 The perianal and digital rectal examinations were                            normal.                           A 4 mm polyp was found in the ascending colon. The                            polyp was sessile. The polyp was removed with a                            cold snare. Resection and retrieval were complete.                            Estimated blood loss was minimal.  A 3 mm polyp was found in the sigmoid colon. The                            polyp was sessile. The polyp was removed with a                            cold snare. Resection and retrieval were complete.                            Estimated blood loss was minimal.                           Non-bleeding internal hemorrhoids were found during                            retroflexion. The hemorrhoids were small. Complications:            No immediate complications. Estimated Blood Loss:     Estimated blood loss was minimal. Impression:               - One 4 mm polyp in the ascending colon, removed                            with a cold snare. Resected and retrieved.                           - One 3 mm polyp in the sigmoid colon, removed with                            a cold snare. Resected and retrieved.                           - Non-bleeding internal hemorrhoids. Recommendation:            - Patient has a contact number available for                            emergencies. The signs and symptoms of potential                            delayed complications were discussed with the                            patient. Return to normal activities tomorrow.                            Written discharge instructions were provided to the                            patient.                           - Resume previous diet.                           -  Continue present medications.                           - Await pathology results.                           - Repeat colonoscopy for surveillance based on                            pathology results.                           - Return to GI clinic PRN. Sandor Flatter, MD 06/08/2024 8:58:53 AM

## 2024-06-09 ENCOUNTER — Telehealth: Payer: Self-pay

## 2024-06-09 NOTE — Telephone Encounter (Signed)
  Follow up Call-     06/08/2024    7:26 AM  Call back number  Post procedure Call Back phone  # [phone]  Permission to leave phone message Yes     Patient questions:  Do you have a fever, pain , or abdominal swelling? No. Pain Score  0 *  Have you tolerated food without any problems? Yes.    Have you been able to return to your normal activities? Yes.    Do you have any questions about your discharge instructions: Diet   No. Medications  No. Follow up visit  No.  Do you have questions or concerns about your Care? No.  Actions: * If pain score is 4 or above: No action needed, pain <4.

## 2024-06-10 LAB — SURGICAL PATHOLOGY

## 2024-06-18 ENCOUNTER — Ambulatory Visit: Payer: Self-pay | Admitting: Gastroenterology

## 2024-08-04 ENCOUNTER — Other Ambulatory Visit: Payer: Self-pay | Admitting: Obstetrics and Gynecology

## 2024-08-04 DIAGNOSIS — Z9189 Other specified personal risk factors, not elsewhere classified: Secondary | ICD-10-CM
